# Patient Record
Sex: Male | Born: 1950 | Race: White | Hispanic: No | Marital: Single | Smoking: Never smoker
Health system: Southern US, Community
[De-identification: ages and names within clinical notes are randomized; demographics above are authoritative.]

## PROBLEM LIST (undated history)

## (undated) DIAGNOSIS — E039 Hypothyroidism, unspecified: Secondary | ICD-10-CM

## (undated) DIAGNOSIS — K219 Gastro-esophageal reflux disease without esophagitis: Secondary | ICD-10-CM

## (undated) DIAGNOSIS — IMO0001 Reserved for inherently not codable concepts without codable children: Secondary | ICD-10-CM

## (undated) DIAGNOSIS — Z87442 Personal history of urinary calculi: Secondary | ICD-10-CM

## (undated) DIAGNOSIS — I499 Cardiac arrhythmia, unspecified: Secondary | ICD-10-CM

## (undated) DIAGNOSIS — J449 Chronic obstructive pulmonary disease, unspecified: Secondary | ICD-10-CM

## (undated) DIAGNOSIS — I82409 Acute embolism and thrombosis of unspecified deep veins of unspecified lower extremity: Secondary | ICD-10-CM

## (undated) DIAGNOSIS — J189 Pneumonia, unspecified organism: Secondary | ICD-10-CM

## (undated) DIAGNOSIS — E0789 Other specified disorders of thyroid: Secondary | ICD-10-CM

## (undated) DIAGNOSIS — I456 Pre-excitation syndrome: Secondary | ICD-10-CM

## (undated) DIAGNOSIS — Z8719 Personal history of other diseases of the digestive system: Secondary | ICD-10-CM

## (undated) DIAGNOSIS — J45909 Unspecified asthma, uncomplicated: Secondary | ICD-10-CM

## (undated) DIAGNOSIS — C801 Malignant (primary) neoplasm, unspecified: Secondary | ICD-10-CM

## (undated) DIAGNOSIS — C73 Malignant neoplasm of thyroid gland: Secondary | ICD-10-CM

## (undated) DIAGNOSIS — I4891 Unspecified atrial fibrillation: Secondary | ICD-10-CM

## (undated) HISTORY — DX: Malignant neoplasm of thyroid gland: C73

## (undated) HISTORY — PX: ABLATION: SHX5711

## (undated) HISTORY — PX: CHOLECYSTECTOMY: SHX55

## (undated) HISTORY — PX: EXCISION OF TONGUE LESION: SHX6434

## (undated) HISTORY — DX: Acute embolism and thrombosis of unspecified deep veins of unspecified lower extremity: I82.409

## (undated) HISTORY — PX: KNEE ARTHROSCOPY W/ MENISCAL REPAIR: SHX1877

## (undated) HISTORY — PX: OTHER SURGICAL HISTORY: SHX169

## (undated) HISTORY — PX: EYE SURGERY: SHX253

## (undated) HISTORY — PX: HERNIA REPAIR: SHX51

## (undated) HISTORY — PX: TONSILLECTOMY: SUR1361

## (undated) HISTORY — DX: Unspecified atrial fibrillation: I48.91

---

## 2003-12-06 HISTORY — PX: COLONOSCOPY: SHX174

## 2003-12-11 ENCOUNTER — Inpatient Hospital Stay (HOSPITAL_COMMUNITY): Admission: AD | Admit: 2003-12-11 | Discharge: 2003-12-12 | Payer: Self-pay | Admitting: Cardiology

## 2003-12-15 ENCOUNTER — Ambulatory Visit (HOSPITAL_COMMUNITY): Admission: RE | Admit: 2003-12-15 | Discharge: 2003-12-16 | Payer: Self-pay | Admitting: Internal Medicine

## 2010-08-31 ENCOUNTER — Ambulatory Visit: Payer: Self-pay | Admitting: Cardiology

## 2011-09-23 DIAGNOSIS — R079 Chest pain, unspecified: Secondary | ICD-10-CM

## 2011-09-26 DIAGNOSIS — R072 Precordial pain: Secondary | ICD-10-CM

## 2013-05-20 DIAGNOSIS — R55 Syncope and collapse: Secondary | ICD-10-CM

## 2014-12-05 DIAGNOSIS — C73 Malignant neoplasm of thyroid gland: Secondary | ICD-10-CM

## 2014-12-05 HISTORY — DX: Malignant neoplasm of thyroid gland: C73

## 2015-04-16 ENCOUNTER — Ambulatory Visit (INDEPENDENT_AMBULATORY_CARE_PROVIDER_SITE_OTHER): Payer: Medicare Other | Admitting: Otolaryngology

## 2015-04-16 DIAGNOSIS — R1312 Dysphagia, oropharyngeal phase: Secondary | ICD-10-CM | POA: Diagnosis not present

## 2015-04-16 DIAGNOSIS — D440 Neoplasm of uncertain behavior of thyroid gland: Secondary | ICD-10-CM

## 2015-04-17 ENCOUNTER — Other Ambulatory Visit (INDEPENDENT_AMBULATORY_CARE_PROVIDER_SITE_OTHER): Payer: Self-pay | Admitting: Otolaryngology

## 2015-04-17 DIAGNOSIS — R221 Localized swelling, mass and lump, neck: Secondary | ICD-10-CM

## 2015-04-17 DIAGNOSIS — E041 Nontoxic single thyroid nodule: Secondary | ICD-10-CM

## 2015-04-20 ENCOUNTER — Other Ambulatory Visit: Payer: Self-pay | Admitting: Otolaryngology

## 2015-04-21 ENCOUNTER — Ambulatory Visit (HOSPITAL_COMMUNITY)
Admission: RE | Admit: 2015-04-21 | Discharge: 2015-04-21 | Disposition: A | Discharge status: Home / Self Care | Payer: Medicare Other | Source: Ambulatory Visit | Attending: Otolaryngology | Admitting: Otolaryngology

## 2015-04-21 DIAGNOSIS — R221 Localized swelling, mass and lump, neck: Secondary | ICD-10-CM | POA: Insufficient documentation

## 2015-04-21 DIAGNOSIS — E041 Nontoxic single thyroid nodule: Secondary | ICD-10-CM | POA: Diagnosis not present

## 2015-04-21 LAB — POCT I-STAT CREATININE: Creatinine, Ser: 0.9 mg/dL (ref 0.61–1.24)

## 2015-04-21 MED ORDER — IOHEXOL 300 MG/ML  SOLN
100.0000 mL | Freq: Once | INTRAMUSCULAR | Status: AC | PRN
  Administered 2015-04-21: 75 mL via INTRAVENOUS

## 2015-05-26 ENCOUNTER — Encounter (HOSPITAL_COMMUNITY): Payer: Self-pay

## 2015-05-26 ENCOUNTER — Encounter (HOSPITAL_COMMUNITY)
Admission: RE | Admit: 2015-05-26 | Discharge: 2015-05-26 | Disposition: A | Discharge status: Home / Self Care | Payer: Medicare Other | Source: Ambulatory Visit | Attending: Otolaryngology | Admitting: Otolaryngology

## 2015-05-26 ENCOUNTER — Encounter (HOSPITAL_COMMUNITY)
Admission: RE | Admit: 2015-05-26 | Discharge: 2015-05-26 | Disposition: A | Discharge status: Home / Self Care | Payer: Medicare Other | Source: Ambulatory Visit | Attending: Anesthesiology | Admitting: Anesthesiology

## 2015-05-26 DIAGNOSIS — Z79899 Other long term (current) drug therapy: Secondary | ICD-10-CM | POA: Diagnosis not present

## 2015-05-26 DIAGNOSIS — K219 Gastro-esophageal reflux disease without esophagitis: Secondary | ICD-10-CM | POA: Insufficient documentation

## 2015-05-26 DIAGNOSIS — Z86711 Personal history of pulmonary embolism: Secondary | ICD-10-CM | POA: Insufficient documentation

## 2015-05-26 DIAGNOSIS — Z01811 Encounter for preprocedural respiratory examination: Secondary | ICD-10-CM

## 2015-05-26 DIAGNOSIS — Z01818 Encounter for other preprocedural examination: Secondary | ICD-10-CM | POA: Insufficient documentation

## 2015-05-26 DIAGNOSIS — E079 Disorder of thyroid, unspecified: Secondary | ICD-10-CM | POA: Diagnosis not present

## 2015-05-26 DIAGNOSIS — I4891 Unspecified atrial fibrillation: Secondary | ICD-10-CM | POA: Insufficient documentation

## 2015-05-26 DIAGNOSIS — Z86718 Personal history of other venous thrombosis and embolism: Secondary | ICD-10-CM | POA: Diagnosis not present

## 2015-05-26 DIAGNOSIS — J9811 Atelectasis: Secondary | ICD-10-CM | POA: Diagnosis not present

## 2015-05-26 DIAGNOSIS — J449 Chronic obstructive pulmonary disease, unspecified: Secondary | ICD-10-CM | POA: Diagnosis not present

## 2015-05-26 DIAGNOSIS — Z01812 Encounter for preprocedural laboratory examination: Secondary | ICD-10-CM | POA: Insufficient documentation

## 2015-05-26 DIAGNOSIS — Z7901 Long term (current) use of anticoagulants: Secondary | ICD-10-CM | POA: Diagnosis not present

## 2015-05-26 DIAGNOSIS — I517 Cardiomegaly: Secondary | ICD-10-CM | POA: Diagnosis not present

## 2015-05-26 HISTORY — DX: Chronic obstructive pulmonary disease, unspecified: J44.9

## 2015-05-26 HISTORY — DX: Personal history of other diseases of the digestive system: Z87.19

## 2015-05-26 HISTORY — DX: Gastro-esophageal reflux disease without esophagitis: K21.9

## 2015-05-26 HISTORY — DX: Pre-excitation syndrome: I45.6

## 2015-05-26 HISTORY — DX: Other specified disorders of thyroid: E07.89

## 2015-05-26 HISTORY — DX: Reserved for inherently not codable concepts without codable children: IMO0001

## 2015-05-26 HISTORY — DX: Cardiac arrhythmia, unspecified: I49.9

## 2015-05-26 HISTORY — DX: Malignant (primary) neoplasm, unspecified: C80.1

## 2015-05-26 LAB — BASIC METABOLIC PANEL
Anion gap: 11 (ref 5–15)
BUN: 12 mg/dL (ref 6–20)
CO2: 30 mmol/L (ref 22–32)
CREATININE: 1.14 mg/dL (ref 0.61–1.24)
Calcium: 9.1 mg/dL (ref 8.9–10.3)
Chloride: 98 mmol/L — ABNORMAL LOW (ref 101–111)
GFR calc Af Amer: >60 mL/min (ref >60)
GFR calc non Af Amer: >60 mL/min (ref >60)
Glucose, Bld: 122 mg/dL — ABNORMAL HIGH (ref 65–99)
Potassium: 4 mmol/L (ref 3.5–5.1)
Sodium: 139 mmol/L (ref 135–145)

## 2015-05-26 LAB — CBC
HCT: 46.5 % (ref 39.0–52.0)
HEMOGLOBIN: 15.7 g/dL (ref 13.0–17.0)
MCH: 32.4 pg (ref 26.0–34.0)
MCHC: 33.8 g/dL (ref 30.0–36.0)
MCV: 95.9 fL (ref 78.0–100.0)
Platelets: 146 10*3/uL — ABNORMAL LOW (ref 150–400)
RBC: 4.85 MIL/uL (ref 4.22–5.81)
RDW: 14.3 % (ref 11.5–15.5)
WBC: 9.1 10*3/uL (ref 4.0–10.5)

## 2015-05-26 NOTE — Pre-Procedure Instructions (Signed)
Jim Wilson  05/26/2015     No Pharmacies Listed   Your procedure is scheduled on : June 29th, Wednesday   Report to [facility] Admitting at 6:30 AM   Call this number if you have problems the morning of surgery:  [phone]   Remember:  Do not eat food or drink liquids after midnight Tuesday.   Take these medicines the morning of surgery with A SIP OF WATER: Cardizem, Prilosec, Flomax   Do not wear jewelry - no rings or watches.  Do not wear lotions or colognes.   You may NOT  wear deodorant the day of surgery.             Men may shave face and neck.   Do not bring valuables to the hospital.  [facility] is not responsible for any belongings or valuables.  Contacts, dentures or bridgework may not be worn into surgery.  Leave your suitcase in the car.  After surgery it may be brought to your room. For patients admitted to the hospital, discharge time will be determined by your treatment team.  Name and phone number of your driver:     Special instructions:  "Preparing for Surgery" instruction sheet.  Please read over the following fact sheets that you were given. Pain Booklet and Surgical Site Infection Prevention

## 2015-05-26 NOTE — Progress Notes (Signed)
   05/26/15 1112  OBSTRUCTIVE SLEEP APNEA  Have you ever been diagnosed with sleep apnea through a sleep study? No  Do you snore loudly (loud enough to be heard through closed doors)?  0 (doesn't know, he lives alone)  Do you often feel tired, fatigued, or sleepy during the daytime? 0  Has anyone observed you stop breathing during your sleep? 0  Do you have, or are you being treated for high blood pressure? 0  BMI more than 35 kg/m2? 1  Age over 69 years old? 1  Neck circumference greater than 40 cm/16 inches? 1  Gender: 1

## 2015-05-27 NOTE — Progress Notes (Addendum)
Anesthesia Chart Review:  Pt is 64 year old male scheduled for R hemithyroidectomy on 06/03/2015 with Dr. Benjamine Mola.   PCP is Rory Percy at [facility].   PMH includes: atrial fibrillation, COPD, WPW syndrome (s/p ablation x2), PE, DVT, GERD. Never smoker. BMI 43.5  Pt does not see cardiology, and has not had trouble with WPW since last ablation which he thinks was 15-18 years ago at  [facility].   Medications include: diltiazem, lasix, potassium, zocor, coumadin. Dr. Nadara Mustard has instructed pt to stop coumadin 05/31/15.   Preoperative labs reviewed.   Chest x-ray 05/26/2015 reviewed. Cardiomegaly. Mild basilar atelectasis. No pulmonary edema. No segmental infiltrate.   EKG 05/26/2015: Atrial fibrillation. Possible Anterolateral infarct. Appears unchanged when compared to EKG dated 08/19/2010.   Nuclear stress test 08/30/2010: -probably normal LV perfusion. Stress testing induced no chest pain symptoms and no ECG changes consistent with ischemia. Mild inferobasal and apical thinning but no ischemia. Global LV systolic function was mildly reduced, with an EF of 47E%. In addition, mild global hypokinesis was present.   Reviewed EKG with Dr. Ermalene Postin.   If no changes, I anticipate pt can proceed with surgery as scheduled.   Willeen Cass, FNP-BC [facility]/Anesthesiology Phone: [phone] 05/27/2015 1:44 PM

## 2015-06-03 ENCOUNTER — Ambulatory Visit (HOSPITAL_COMMUNITY)
Admission: RE | Admit: 2015-06-03 | Discharge: 2015-06-04 | Disposition: A | Discharge status: Home / Self Care | Payer: Medicare Other | Source: Ambulatory Visit | Attending: Otolaryngology | Admitting: Otolaryngology

## 2015-06-03 ENCOUNTER — Ambulatory Visit (HOSPITAL_COMMUNITY): Payer: Medicare Other | Admitting: Certified Registered Nurse Anesthetist

## 2015-06-03 ENCOUNTER — Ambulatory Visit (HOSPITAL_COMMUNITY): Payer: Medicare Other | Admitting: Emergency Medicine

## 2015-06-03 ENCOUNTER — Encounter (HOSPITAL_COMMUNITY)
Admission: RE | Disposition: A | Discharge status: Home / Self Care | Payer: Self-pay | Source: Ambulatory Visit | Attending: Otolaryngology

## 2015-06-03 ENCOUNTER — Encounter (HOSPITAL_COMMUNITY): Payer: Self-pay | Admitting: Certified Registered Nurse Anesthetist

## 2015-06-03 DIAGNOSIS — Z9889 Other specified postprocedural states: Secondary | ICD-10-CM

## 2015-06-03 DIAGNOSIS — D34 Benign neoplasm of thyroid gland: Secondary | ICD-10-CM | POA: Insufficient documentation

## 2015-06-03 DIAGNOSIS — D440 Neoplasm of uncertain behavior of thyroid gland: Secondary | ICD-10-CM | POA: Diagnosis not present

## 2015-06-03 DIAGNOSIS — C73 Malignant neoplasm of thyroid gland: Secondary | ICD-10-CM | POA: Insufficient documentation

## 2015-06-03 DIAGNOSIS — J449 Chronic obstructive pulmonary disease, unspecified: Secondary | ICD-10-CM | POA: Insufficient documentation

## 2015-06-03 DIAGNOSIS — K219 Gastro-esophageal reflux disease without esophagitis: Secondary | ICD-10-CM | POA: Insufficient documentation

## 2015-06-03 DIAGNOSIS — Z6841 Body Mass Index (BMI) 40.0 and over, adult: Secondary | ICD-10-CM | POA: Diagnosis not present

## 2015-06-03 DIAGNOSIS — E890 Postprocedural hypothyroidism: Secondary | ICD-10-CM

## 2015-06-03 DIAGNOSIS — E042 Nontoxic multinodular goiter: Secondary | ICD-10-CM | POA: Diagnosis present

## 2015-06-03 HISTORY — PX: THYROIDECTOMY: SHX17

## 2015-06-03 LAB — PROTIME-INR
INR: 1.29 (ref 0.00–1.49)
PROTHROMBIN TIME: 16.3 s — AB (ref 11.6–15.2)

## 2015-06-03 LAB — APTT: APTT: 30 s (ref 24–37)

## 2015-06-03 SURGERY — THYROIDECTOMY
Anesthesia: General | Site: Neck | Laterality: Right

## 2015-06-03 MED ORDER — ONDANSETRON HCL 4 MG/2ML IJ SOLN: 4.0000 mg | INTRAMUSCULAR | Status: DC | PRN

## 2015-06-03 MED ORDER — 0.9 % SODIUM CHLORIDE (POUR BTL) OPTIME
TOPICAL | Status: DC | PRN
  Administered 2015-06-03: 1000 mL

## 2015-06-03 MED ORDER — SODIUM CHLORIDE 0.9 % IV SOLN
0.0125 ug/kg/min | INTRAVENOUS | Status: DC
  Administered 2015-06-03: .05 ug/kg/min via INTRAVENOUS
  Filled 2015-06-03: qty 1000

## 2015-06-03 MED ORDER — MIDAZOLAM HCL 2 MG/2ML IJ SOLN
INTRAMUSCULAR | Status: AC
  Filled 2015-06-03: qty 2

## 2015-06-03 MED ORDER — CEFAZOLIN SODIUM-DEXTROSE 2-3 GM-% IV SOLR
INTRAVENOUS | Status: DC | PRN
  Administered 2015-06-03: 2 g via INTRAVENOUS

## 2015-06-03 MED ORDER — LIDOCAINE HCL 4 % MT SOLN
OROMUCOSAL | Status: DC | PRN
  Administered 2015-06-03: 4 mL via TOPICAL

## 2015-06-03 MED ORDER — FENTANYL CITRATE (PF) 100 MCG/2ML IJ SOLN
INTRAMUSCULAR | Status: DC | PRN
  Administered 2015-06-03: 100 ug via INTRAVENOUS
  Administered 2015-06-03: 50 ug via INTRAVENOUS

## 2015-06-03 MED ORDER — ARTIFICIAL TEARS OP OINT
TOPICAL_OINTMENT | OPHTHALMIC | Status: AC
  Filled 2015-06-03: qty 3.5

## 2015-06-03 MED ORDER — ONDANSETRON HCL 4 MG/2ML IJ SOLN
INTRAMUSCULAR | Status: DC | PRN
  Administered 2015-06-03: 4 mg via INTRAVENOUS

## 2015-06-03 MED ORDER — MOMETASONE FURO-FORMOTEROL FUM 100-5 MCG/ACT IN AERO
2.0000 | INHALATION_SPRAY | Freq: Two times a day (BID) | RESPIRATORY_TRACT | Status: DC
  Administered 2015-06-03 – 2015-06-04 (×2): 2 via RESPIRATORY_TRACT
  Filled 2015-06-03: qty 8.8

## 2015-06-03 MED ORDER — KETOCONAZOLE 2 % EX CREA
1.0000 "application " | TOPICAL_CREAM | Freq: Three times a day (TID) | CUTANEOUS | Status: DC | PRN
  Filled 2015-06-03: qty 15

## 2015-06-03 MED ORDER — POTASSIUM CHLORIDE ER 10 MEQ PO TBCR
10.0000 meq | EXTENDED_RELEASE_TABLET | Freq: Every day | ORAL | Status: DC
Start: 2015-06-03 — End: 2015-06-04
  Administered 2015-06-03 – 2015-06-04 (×2): 10 meq via ORAL
  Filled 2015-06-03 (×4): qty 1

## 2015-06-03 MED ORDER — PHENYLEPHRINE 40 MCG/ML (10ML) SYRINGE FOR IV PUSH (FOR BLOOD PRESSURE SUPPORT)
PREFILLED_SYRINGE | INTRAVENOUS | Status: AC
  Filled 2015-06-03: qty 10

## 2015-06-03 MED ORDER — OXYCODONE-ACETAMINOPHEN 5-325 MG PO TABS: 1.0000 | ORAL_TABLET | ORAL | Status: DC | PRN

## 2015-06-03 MED ORDER — FUROSEMIDE 80 MG PO TABS
80.0000 mg | ORAL_TABLET | Freq: Every day | ORAL | Status: DC
  Administered 2015-06-03: 80 mg via ORAL
  Filled 2015-06-03 (×2): qty 1

## 2015-06-03 MED ORDER — PROPOFOL 10 MG/ML IV BOLUS
INTRAVENOUS | Status: DC | PRN
  Administered 2015-06-03: 150 mg via INTRAVENOUS

## 2015-06-03 MED ORDER — KCL IN DEXTROSE-NACL 20-5-0.45 MEQ/L-%-% IV SOLN
INTRAVENOUS | Status: DC
  Administered 2015-06-03: 22:00:00 via INTRAVENOUS
  Filled 2015-06-03: qty 1000

## 2015-06-03 MED ORDER — LIDOCAINE-EPINEPHRINE 1 %-1:100000 IJ SOLN
INTRAMUSCULAR | Status: AC
  Filled 2015-06-03: qty 1

## 2015-06-03 MED ORDER — DILTIAZEM HCL ER COATED BEADS 120 MG PO CP24
120.0000 mg | ORAL_CAPSULE | Freq: Every day | ORAL | Status: DC
  Administered 2015-06-03 – 2015-06-04 (×2): 120 mg via ORAL
  Filled 2015-06-03 (×2): qty 1

## 2015-06-03 MED ORDER — ALBUTEROL SULFATE (2.5 MG/3ML) 0.083% IN NEBU: 2.5000 mg | INHALATION_SOLUTION | Freq: Four times a day (QID) | RESPIRATORY_TRACT | Status: DC | PRN

## 2015-06-03 MED ORDER — PROMETHAZINE HCL 25 MG/ML IJ SOLN: 6.2500 mg | INTRAMUSCULAR | Status: DC | PRN

## 2015-06-03 MED ORDER — PHENYLEPHRINE HCL 10 MG/ML IJ SOLN
INTRAMUSCULAR | Status: DC | PRN
  Administered 2015-06-03: 80 ug via INTRAVENOUS
  Administered 2015-06-03: 40 ug via INTRAVENOUS
  Administered 2015-06-03: 120 ug via INTRAVENOUS
  Administered 2015-06-03 (×2): 80 ug via INTRAVENOUS

## 2015-06-03 MED ORDER — SUCCINYLCHOLINE CHLORIDE 20 MG/ML IJ SOLN
INTRAMUSCULAR | Status: DC | PRN
  Administered 2015-06-03: 140 mg via INTRAVENOUS

## 2015-06-03 MED ORDER — TAMSULOSIN HCL 0.4 MG PO CAPS
0.4000 mg | ORAL_CAPSULE | Freq: Two times a day (BID) | ORAL | Status: DC
Start: 2015-06-03 — End: 2015-06-04
  Administered 2015-06-03 – 2015-06-04 (×3): 0.4 mg via ORAL
  Filled 2015-06-03 (×3): qty 1

## 2015-06-03 MED ORDER — PANTOPRAZOLE SODIUM 40 MG PO TBEC
40.0000 mg | DELAYED_RELEASE_TABLET | Freq: Every day | ORAL | Status: DC
Start: 2015-06-03 — End: 2015-06-04
  Administered 2015-06-03 – 2015-06-04 (×2): 40 mg via ORAL
  Filled 2015-06-03 (×2): qty 1

## 2015-06-03 MED ORDER — LIDOCAINE-EPINEPHRINE 1 %-1:100000 IJ SOLN
INTRAMUSCULAR | Status: DC | PRN
  Administered 2015-06-03: 20 mL

## 2015-06-03 MED ORDER — LIDOCAINE HCL (CARDIAC) 20 MG/ML IV SOLN
INTRAVENOUS | Status: DC | PRN
  Administered 2015-06-03: 100 mg via INTRAVENOUS

## 2015-06-03 MED ORDER — MORPHINE SULFATE 2 MG/ML IJ SOLN
2.0000 mg | INTRAMUSCULAR | Status: DC | PRN
  Administered 2015-06-03: 2 mg via INTRAVENOUS
  Filled 2015-06-03: qty 1

## 2015-06-03 MED ORDER — LACTATED RINGERS IV SOLN
INTRAVENOUS | Status: DC | PRN
  Administered 2015-06-03 (×2): via INTRAVENOUS

## 2015-06-03 MED ORDER — SIMVASTATIN 20 MG PO TABS
20.0000 mg | ORAL_TABLET | Freq: Every day | ORAL | Status: DC
  Administered 2015-06-03: 20 mg via ORAL
  Filled 2015-06-03: qty 1

## 2015-06-03 MED ORDER — MIDAZOLAM HCL 5 MG/5ML IJ SOLN
INTRAMUSCULAR | Status: DC | PRN
  Administered 2015-06-03: 2 mg via INTRAVENOUS

## 2015-06-03 MED ORDER — ZOLPIDEM TARTRATE 5 MG PO TABS: 5.0000 mg | ORAL_TABLET | Freq: Every evening | ORAL | Status: DC | PRN

## 2015-06-03 MED ORDER — AMOXICILLIN 875 MG PO TABS: 875.0000 mg | ORAL_TABLET | Freq: Two times a day (BID) | ORAL | Status: DC

## 2015-06-03 MED ORDER — FENTANYL CITRATE (PF) 250 MCG/5ML IJ SOLN
INTRAMUSCULAR | Status: AC
  Filled 2015-06-03: qty 5

## 2015-06-03 MED ORDER — MEPERIDINE HCL 25 MG/ML IJ SOLN: 6.2500 mg | INTRAMUSCULAR | Status: DC | PRN

## 2015-06-03 MED ORDER — HYDROMORPHONE HCL 1 MG/ML IJ SOLN: 0.2500 mg | INTRAMUSCULAR | Status: DC | PRN

## 2015-06-03 MED ORDER — LIDOCAINE HCL (CARDIAC) 20 MG/ML IV SOLN
INTRAVENOUS | Status: AC
  Filled 2015-06-03: qty 10

## 2015-06-03 MED ORDER — ATORVASTATIN CALCIUM 10 MG PO TABS: 10.0000 mg | ORAL_TABLET | Freq: Every day | ORAL | Status: DC

## 2015-06-03 MED ORDER — DEXAMETHASONE SODIUM PHOSPHATE 10 MG/ML IJ SOLN
INTRAMUSCULAR | Status: DC | PRN
  Administered 2015-06-03: 10 mg via INTRAVENOUS

## 2015-06-03 MED ORDER — ONDANSETRON HCL 4 MG/2ML IJ SOLN
INTRAMUSCULAR | Status: AC
  Filled 2015-06-03: qty 2

## 2015-06-03 MED ORDER — PROPOFOL 10 MG/ML IV BOLUS
INTRAVENOUS | Status: AC
  Filled 2015-06-03: qty 20

## 2015-06-03 MED ORDER — ONDANSETRON HCL 4 MG PO TABS: 4.0000 mg | ORAL_TABLET | ORAL | Status: DC | PRN

## 2015-06-03 MED ORDER — GLYCOPYRROLATE 0.2 MG/ML IJ SOLN
INTRAMUSCULAR | Status: DC | PRN
  Administered 2015-06-03 (×2): 0.1 mg via INTRAVENOUS

## 2015-06-03 MED ORDER — SUCCINYLCHOLINE CHLORIDE 20 MG/ML IJ SOLN
INTRAMUSCULAR | Status: AC
  Filled 2015-06-03: qty 1

## 2015-06-03 SURGICAL SUPPLY — 54 items
ATTRACTOMAT 16X20 MAGNETIC DRP (DRAPES) ×3 IMPLANT
BENZOIN TINCTURE PRP APPL 2/3 (GAUZE/BANDAGES/DRESSINGS) ×3 IMPLANT
BLADE 10 SAFETY STRL DISP (BLADE) ×3 IMPLANT
BLADE SURG 15 STRL LF DISP TIS (BLADE) ×1 IMPLANT
BLADE SURG 15 STRL SS (BLADE) ×2
BLADE SURG CLIPPER 3M 9600 (MISCELLANEOUS) ×3 IMPLANT
CANISTER SUCTION 2500CC (MISCELLANEOUS) ×3 IMPLANT
CLEANER TIP ELECTROSURG 2X2 (MISCELLANEOUS) ×3 IMPLANT
CLIP TI WIDE RED SMALL 24 (CLIP) IMPLANT
CONT SPEC 4OZ CLIKSEAL STRL BL (MISCELLANEOUS) ×3 IMPLANT
CORDS BIPOLAR (ELECTRODE) ×6 IMPLANT
COVER SURGICAL LIGHT HANDLE (MISCELLANEOUS) ×3 IMPLANT
CRADLE DONUT ADULT HEAD (MISCELLANEOUS) ×3 IMPLANT
DERMABOND ADVANCED (GAUZE/BANDAGES/DRESSINGS) ×2
DERMABOND ADVANCED .7 DNX12 (GAUZE/BANDAGES/DRESSINGS) ×1 IMPLANT
DRAIN CHANNEL 10F 3/8 F FF (DRAIN) ×3 IMPLANT
DRAPE PROXIMA HALF (DRAPES) ×3 IMPLANT
DRAPE U-SHAPE 76X120 STRL (DRAPES) ×3 IMPLANT
ELECT COATED BLADE 2.86 ST (ELECTRODE) ×3 IMPLANT
ELECT REM PT RETURN 9FT ADLT (ELECTROSURGICAL) ×3
ELECTRODE REM PT RTRN 9FT ADLT (ELECTROSURGICAL) ×1 IMPLANT
EVACUATOR SILICONE 100CC (DRAIN) ×3 IMPLANT
GAUZE SPONGE 4X4 16PLY XRAY LF (GAUZE/BANDAGES/DRESSINGS) ×9 IMPLANT
GLOVE BIO SURGEON STRL SZ 6.5 (GLOVE) ×2 IMPLANT
GLOVE BIO SURGEON STRL SZ7 (GLOVE) ×3 IMPLANT
GLOVE BIO SURGEON STRL SZ7.5 (GLOVE) ×3 IMPLANT
GLOVE BIO SURGEONS STRL SZ 6.5 (GLOVE) ×1
GLOVE BIOGEL PI IND STRL 6.5 (GLOVE) ×1 IMPLANT
GLOVE BIOGEL PI IND STRL 7.0 (GLOVE) ×2 IMPLANT
GLOVE BIOGEL PI INDICATOR 6.5 (GLOVE) ×2
GLOVE BIOGEL PI INDICATOR 7.0 (GLOVE) ×4
GLOVE ECLIPSE 7.5 STRL STRAW (GLOVE) ×3 IMPLANT
GLOVE SURG SS PI 6.5 STRL IVOR (GLOVE) ×3 IMPLANT
GOWN STRL REUS W/ TWL LRG LVL3 (GOWN DISPOSABLE) ×4 IMPLANT
GOWN STRL REUS W/TWL LRG LVL3 (GOWN DISPOSABLE) ×8
HEMOSTAT SURGICEL 2X14 (HEMOSTASIS) IMPLANT
KIT BASIN OR (CUSTOM PROCEDURE TRAY) ×3 IMPLANT
KIT ROOM TURNOVER OR (KITS) ×3 IMPLANT
LIQUID BAND (GAUZE/BANDAGES/DRESSINGS) IMPLANT
NEEDLE HYPO 25GX1X1/2 BEV (NEEDLE) ×3 IMPLANT
NS IRRIG 1000ML POUR BTL (IV SOLUTION) ×3 IMPLANT
PAD ARMBOARD 7.5X6 YLW CONV (MISCELLANEOUS) ×3 IMPLANT
PENCIL BUTTON HOLSTER BLD 10FT (ELECTRODE) ×3 IMPLANT
PROBE NERVBE PRASS .33 (MISCELLANEOUS) ×3 IMPLANT
SHEARS HARMONIC 9CM CVD (BLADE) ×3 IMPLANT
SPONGE INTESTINAL PEANUT (DISPOSABLE) ×3 IMPLANT
SUT ETHILON 2 0 FS 18 (SUTURE) ×3 IMPLANT
SUT PROLENE 6 0 CC 1 (SUTURE) ×3 IMPLANT
SUT SILK 2 0 FS (SUTURE) ×3 IMPLANT
SUT SILK 3 0 REEL (SUTURE) ×3 IMPLANT
SUT VICRYL 4-0 PS2 18IN ABS (SUTURE) ×6 IMPLANT
TAPE CLOTH 4X10 WHT NS (GAUZE/BANDAGES/DRESSINGS) ×3 IMPLANT
TRAY ENT MC OR (CUSTOM PROCEDURE TRAY) ×3 IMPLANT
TUBE ENDOTRAC EMG 8X11.3 (MISCELLANEOUS) ×3 IMPLANT

## 2015-06-03 NOTE — H&P (Signed)
Cc: Large right thyroid mass  HPI: The patient is a 64 y/o male who presents today for evaluation of bilateral thyroid nodules. The patient is seen in consultation requested by [place]. According to the patient, he was noted by the [facility] nurse to have an enlarged thyroid. He subsequently underwent a neck US which showed that the entire right lobe was occupied by a nodule measuring 4.9 cm. A 2.8 cm nodule was also noted along the isthmus.  The patient currently denies any significant dysphagia or difficulty breathing. He had some benign tongue lesions removed in the past. The patient states his mother had a thyroid goiter but never had it removed.  The patient's review of systems (constitutional, eyes, ENT, cardiovascular, respiratory, GI, musculoskeletal, skin, neurologic, psychiatric, endocrine, hematologic, allergic) is noted in the ROS questionnaire.  It is reviewed with the patient.   Allergies: None.  Family health history: None.   Major events: Hernia,history of pulmonary emboli, Yves Dill Parkinson White syndrome treated with ablation.   Ongoing medical problems: Cataracts, hearing loss, irregular pulse, reflux, ulcers, arthritis, skin cancer, hay fever.   Social history: The patient is single. He denies the use of alcohol, tobacco or illegal drugs.  Exam General: Communicates without difficulty, obese, no acute distress. Head: Normocephalic, no evidence injury, no tenderness, facial buttresses intact without stepoff. Eyes: PERRL, EOMI. No scleral icterus, conjunctivae clear. Neuro: CN II exam reveals vision grossly intact.  No nystagmus at any point of gaze. Ears: Auricles well formed without lesions.  Ear canals are intact without mass or lesion.  No erythema or edema is appreciated.  The TMs are intact without fluid. Nose: External evaluation reveals normal support and skin without lesions.  Dorsum is intact.  Anterior rhinoscopy reveals healthy pink mucosa over anterior  aspect of inferior turbinates and intact septum.  No purulence noted. Oral:  Oral cavity and oropharynx are intact, symmetric, without erythema or edema.  Mucosa is moist without lesions. Neck: Full range of motion without pain.  There is no significant lymphadenopathy.  No masses palpable.  Thyroid bed is enlarged with palpable right thyroid nodule. Isthmus nodule is also palpable. Parotid glands and submandibular glands equal bilaterally without mass.  Trachea is shifted to the left. Neuro:  CN 2-12 grossly intact. Gait normal. Vestibular: No nystagmus at any point of gaze. The cerebellar examination is unremarkable.   Procedure:  Flexible Fiberoptic Laryngoscopy Risks, benefits, and alternatives of flexible endoscopy were explained to the patient.  Specific mention was made of the risk of throat numbness with difficulty swallowing, possible bleeding from the nose and mouth, and pain from the procedure.  The patient gave oral consent to proceed.  The nasal cavities were decongested and anesthetised with a combination of oxymetazoline and 4% lidocaine solution.  The flexible scope was inserted into the right nasal cavity and advanced towards the nasopharynx.  Visualized mucosa over the turbinates and septum were as described above.  The nasopharynx was clear.  Oropharyngeal walls were symmetric and mobile without lesion, mass, or edema.  Hypopharynx was also without  lesion or edema.  Larynx was mobile without lesions. Supraglottic structures were free of edema, mass, and asymmetry.  True vocal folds were white without mass or lesion.  Base of tongue was within normal limits.  Assessment 1.  The patient is noted to have a multinodular thyroid goiter. The right nodule measures 4.9 cm, isthmus 2.8, and a  1.2 cm nodule is noted on the left. 2.  The trachea is  shifted to the left. The patient's vocal cords are noted to be mobile. No other suspicious mass or lesion is noted on today's fiberoptic laryngoscopy  exam.  Plan 1.  The ultrasound and laryngoscopy findings are discussed with the patient.  2.  Treatment options are discussed, which includes observation, fine needle biopsy, versus right hemithyroidectomy. The risks, benefits, alternatives, and details of the procedure are reviewed with the patient. Questions are invited and answered. In light of the size of the nodules and tracheal deviation, the patient will likely benefit from undergoing a hemithyroidectomy procedure. 3.  The patient would like to proceed with surgical intervention. We will schedule the procedure in accordance with the patient's schedule.

## 2015-06-03 NOTE — Anesthesia Procedure Notes (Signed)
Procedure Name: Intubation Performed by: Judeth Cornfield T Pre-anesthesia Checklist: Patient identified, Emergency Drugs available, Suction available, Patient being monitored and Timeout performed Patient Re-evaluated:Patient Re-evaluated prior to inductionOxygen Delivery Method: Circle system utilized Preoxygenation: Pre-oxygenation with 100% oxygen Intubation Type: IV induction Ventilation: Mask ventilation without difficulty Laryngoscope Size: Mac, 4 and Glidescope Grade View: Grade I Tube type: Oral (Nimms ETT) Tube size: 8.0 mm Number of attempts: 1 Airway Equipment and Method: Patient positioned with wedge pillow and Video-laryngoscopy Placement Confirmation: ETT inserted through vocal cords under direct vision,  positive ETCO2 and breath sounds checked- equal and bilateral Secured at: 21 cm Tube secured with: Tape Dental Injury: Teeth and Oropharynx as per pre-operative assessment  Difficulty Due To: Difficulty was anticipated and Difficult Airway- due to large tongue Future Recommendations: Recommend- induction with short-acting agent, and alternative techniques readily available

## 2015-06-03 NOTE — Transfer of Care (Signed)
Immediate Anesthesia Transfer of Care Note  Patient: Jim Wilson  Procedure(s) Performed: Procedure(s): RIGHT HEMI THYROIDECTOMY (Right)  Patient Location: PACU  Anesthesia Type:General  Level of Consciousness: awake and patient cooperative  Airway & Oxygen Therapy: Patient Spontanous Breathing and Patient connected to face mask oxygen  Post-op Assessment: Report given to RN and Post -op Vital signs reviewed and stable  Post vital signs: Reviewed and stable  Last Vitals:  Filed Vitals:   06/03/15 0637  BP: 113/49  Pulse: 88  Temp: 36.6 C  Resp: 20    Complications: No apparent anesthesia complications

## 2015-06-03 NOTE — Discharge Instructions (Signed)
Thyroidectomy Care After Refer to this sheet in the next few weeks. These instructions provide you with general information on caring for yourself after you leave the hospital. Your caregiver also may give you specific instructions. Your treatment has been planned according to the most current medical practices available, but problems sometimes occur. Call your caregiver if you have any problems or questions after your procedure. HOME CARE INSTRUCTIONS   It is normal to be sore for a few weeks following surgery. See your caregiver if your pain seems to be getting worse rather than better.  You may resume a normal diet and activities as directed by your caregiver.  Avoid lifting weight greater than 20 lb (9 kg) or participating in heavy exercise or contact sports for 10 days or as instructed by your caregiver. SEEK MEDICAL CARE IF:   You have increased bleeding from your wound.  You have redness, swelling, or increasing pain from your wound or in your neck.  There is pus coming from your wound.  You have an oral temperature above 102 F (38.9 C).  There is a bad smell coming from the wound or dressing.  You develop lightheadedness or feel faint.  You develop numbness, tingling, or muscle spasms in your arms, hands, feet, or face.  You have difficulty swallowing. SEEK IMMEDIATE MEDICAL CARE IF:   You develop a rash.  You have difficulty breathing.  You hear whistling noises that come from your chest.  You develop a cough that becomes increasingly worse.  You develop any reaction or side effects to medicines given.  There is swelling in your neck.  You develop changes in speech or hoarseness, which is getting worse. MAKE SURE YOU:   Understand these instructions.  Will watch your condition.  Will get help right away if you are not doing well or get worse. Document Released: 06/10/2005 Document Revised: 02/13/2012 Document Reviewed: 01/28/2011 [facility] Patient  Information 2015 [place], [place]. This information is not intended to replace advice given to you by your health care provider. Make sure you discuss any questions you have with your health care provider.

## 2015-06-03 NOTE — Op Note (Signed)
DATE OF PROCEDURE:  06/03/2015                              OPERATIVE REPORT  SURGEON:  Leta Baptist, MD  ASSISTANT: Rometta Emery, PA-C  PREOPERATIVE DIAGNOSES: 1. Large right thyroid mass  POSTOPERATIVE DIAGNOSES: 1. Large right thyroid mass  PROCEDURE PERFORMED:  Right hemithyroidectomy  ANESTHESIA:  General endotracheal tube anesthesia.  COMPLICATIONS:  None.  ESTIMATED BLOOD LOSS:  Less than 26m  INDICATION FOR PROCEDURE:  BNASHTON BELSONis a 64y.o. male with a history of bilateral thyroid nodules. According to the patient, [facility]. He subsequently underwent a neck [place] which showed that the entire right lobe was occupied by a nodule measuring 4.9 cm. A 2.8 cm nodule was also noted along the isthmus. The large thyroid mass caused significant compression and deviation of the trachea to the left. Based on the above findings, the decision was made for patient to undergo the right hemithyroidectomy surgery. Likelihood of success in reducing symptoms was also discussed.  The risks, benefits, alternatives, and details of the procedure were discussed with the mother.  Questions were invited and answered.  Informed consent was obtained.  DESCRIPTION:  The patient was taken to the operating room and placed supine on the operating table.  General endotracheal tube anesthesia was administered by the anesthesiologist.  The patient was positioned and prepped and draped in a standard fashion for thyroidectomy surgery. NIMS recurrent laryngeal nerve monitoring endotracheal tube was placed. The monitoring system was functional throughout the case. Preop IV antibiotics was given.    1% lidocaine with 1-100,000 epinephrine was infiltrated into the planned site of incision. A transverse lower neck incision was made. The incision was carried down past the level of the platysma muscles. Superior and inferiorly based subplatysmal flaps were elevated  in a standard fashion. The strap muscles were divided at midline and retracted laterally, exposing a large right thyroid mass and a smaller isthmus nodule. Careful dissection was carried out to free the right thyroid lobe and isthmus from the surrounding soft tissue. The right recurrent laryngeal nerve was identified and preserved. The nerve was noted to be functional throughout the case. 2 possible parathyroid candidates were also identified and preserved. The entire right thyroid lobe and isthmus were removed. They were sent to the pathology department for permanent histologic identification. The surgical site was copiously irrigated. A #10 JP drain was placed. The strap muscles were closed with 4-0 Vicryls sutures. The incision was subsequently closed in layers with 4-0 Vicryls and Dermabond.   The care of the patient was turned over to the anesthesiologist.  The patient was awakened from anesthesia without difficulty.  He was extubated and transferred to the recovery room in good condition.  OPERATIVE FINDING: A large right thyroid mass and a smaller isthmus nodule.  SPECIMEN:  Right thyroid lobe and isthmus.  FOLLOWUP CARE:  The patient will be observed overnight in the hospital. He will most likely be discharged home on postop day #1.   TAscencion Dike6/29/2016 10:23 AM

## 2015-06-03 NOTE — Anesthesia Preprocedure Evaluation (Addendum)
Anesthesia Evaluation  Patient identified by MRN, date of birth, ID band Patient awake    Reviewed: Allergy & Precautions, NPO status , Patient's Chart, lab work & pertinent test results  Airway Mallampati: II  TM Distance: >3 FB Neck ROM: Full    Dental no notable dental hx.    Pulmonary shortness of breath, COPD COPD inhaler,  breath sounds clear to auscultation  Pulmonary exam normal       Cardiovascular negative cardio ROS Normal cardiovascular exam+ dysrhythmias Rhythm:Regular Rate:Normal     Neuro/Psych negative neurological ROS  negative psych ROS   GI/Hepatic Neg liver ROS, hiatal hernia, GERD-  ,  Endo/Other  Morbid obesity  Renal/GU Renal disease     Musculoskeletal negative musculoskeletal ROS (+)   Abdominal (+) + obese,   Peds  Hematology negative hematology ROS (+)   Anesthesia Other Findings   Reproductive/Obstetrics                             Anesthesia Physical Anesthesia Plan  ASA: III  Anesthesia Plan: General   Post-op Pain Management:    Induction: Intravenous  Airway Management Planned: Oral ETT  Additional Equipment:   Intra-op Plan:   Post-operative Plan: Extubation in OR  Informed Consent: I have reviewed the patients History and Physical, chart, labs and discussed the procedure including the risks, benefits and alternatives for the proposed anesthesia with the patient or authorized representative who has indicated his/her understanding and acceptance.   Dental advisory given  Plan Discussed with: CRNA  Anesthesia Plan Comments: (+/- VL)        Anesthesia Quick Evaluation

## 2015-06-04 ENCOUNTER — Encounter (HOSPITAL_COMMUNITY): Payer: Self-pay | Admitting: Otolaryngology

## 2015-06-04 DIAGNOSIS — C73 Malignant neoplasm of thyroid gland: Secondary | ICD-10-CM | POA: Diagnosis not present

## 2015-06-04 NOTE — Progress Notes (Signed)
Discharge instructions explained and pt denies any questions at this time.  Prescriptions given by MD to friend yesterday 6/29 after surgery.  Confirmed this with MD.  Iv removed and site CDI.  Pt discharged to home in no s/s of distress, Graceann Congress

## 2015-06-04 NOTE — Anesthesia Postprocedure Evaluation (Signed)
Anesthesia Post Note  Patient: Jim Wilson  Procedure(s) Performed: Procedure(s) (LRB): RIGHT HEMI THYROIDECTOMY (Right)  Anesthesia type: General  Patient location: PACU  Post pain: Pain level controlled  Post assessment: Post-op Vital signs reviewed  Last Vitals: BP 123/75 mmHg  Pulse 56  Temp(Src) 36.6 C (Oral)  Resp 18  Ht '6\' 1"'$  (5.872 m)  Wt 330 lb (149.687 kg)  BMI 43.55 kg/m2  SpO2 97%  Post vital signs: Reviewed  Level of consciousness: sedated  Complications: No apparent anesthesia complications

## 2015-06-04 NOTE — Discharge Summary (Signed)
Physician Discharge Summary  Patient ID: Jim Wilson MRN: [id number] DOB/AGE: 01/23/1951 64 y.o.  Admit date: 06/03/2015 Discharge date: 06/04/2015  Admission Diagnoses: Right thyroid mass  Discharge Diagnoses: Right thyroid mass Active Problems:   S/P partial thyroidectomy   Discharged Condition: good  Hospital Course: Pt had an uneventful overnight stay. Pt tolerated po well. No bleeding. No stridor. Voice is strong  Consults: None  Significant Diagnostic Studies: none  Treatments: surgery: right hemithyroidectomy  Discharge Exam: Blood pressure 118/67, pulse 56, temperature 97.8 F (36.6 C), temperature source Oral, resp. rate 18, height '6\' 1"'$  (1.854 m), weight 149.687 kg (330 lb), SpO2 97 %. Incision/Wound:c/d/i Voice is strong  Disposition:   Discharge Instructions    Activity as tolerated - No restrictions    Complete by:  As directed      Diet general    Complete by:  As directed             Medication List    STOP taking these medications        warfarin 3 MG tablet  Commonly known as:  COUMADIN      TAKE these medications        albuterol (2.5 MG/3ML) 0.083% nebulizer solution  Commonly known as:  PROVENTIL  Take 2.5 mg by nebulization every 6 (six) hours as needed for wheezing or shortness of breath.     amoxicillin 875 MG tablet  Commonly known as:  AMOXIL  Take 1 tablet (875 mg total) by mouth 2 (two) times daily.     cetirizine 10 MG tablet  Commonly known as:  ZYRTEC  Take 10 mg by mouth daily.     diltiazem 120 MG 24 hr capsule  Commonly known as:  CARDIZEM CD  Take 120 mg by mouth daily.     Fluticasone-Salmeterol 250-50 MCG/DOSE Aepb  Commonly known as:  ADVAIR  Inhale 2 puffs into the lungs daily.     furosemide 40 MG tablet  Commonly known as:  LASIX  Take 80 mg by mouth daily.     ketoconazole 2 % cream  Commonly known as:  NIZORAL  Apply 1 application topically 3 (three) times daily as needed for irritation.     omeprazole 20 MG capsule  Commonly known as:  PRILOSEC  Take 20 mg by mouth daily.     oxyCODONE-acetaminophen 5-325 MG per tablet  Commonly known as:  ROXICET  Take 1-2 tablets by mouth every 4 (four) hours as needed for severe pain.     potassium chloride 10 MEQ tablet  Commonly known as:  K-DUR  Take 10 mEq by mouth daily.     simvastatin 20 MG tablet  Commonly known as:  ZOCOR  Take 20 mg by mouth daily.     tamsulosin 0.4 MG Caps capsule  Commonly known as:  FLOMAX  Take 0.4 mg by mouth 2 (two) times daily.           Follow-up Information    Follow up with Ascencion Dike, MD On 06/11/2015.   Specialty:  Otolaryngology   Why:  As scheduled   Contact information:   [address] [place] [address] [phone]       Signed: Ascencion Dike 06/04/2015, 10:05 AM

## 2015-06-11 ENCOUNTER — Ambulatory Visit (INDEPENDENT_AMBULATORY_CARE_PROVIDER_SITE_OTHER): Payer: Medicare Other | Admitting: Otolaryngology

## 2015-06-11 DIAGNOSIS — C73 Malignant neoplasm of thyroid gland: Secondary | ICD-10-CM

## 2015-12-08 DIAGNOSIS — R05 Cough: Secondary | ICD-10-CM | POA: Diagnosis not present

## 2015-12-08 DIAGNOSIS — R609 Edema, unspecified: Secondary | ICD-10-CM | POA: Diagnosis not present

## 2015-12-08 DIAGNOSIS — J441 Chronic obstructive pulmonary disease with (acute) exacerbation: Secondary | ICD-10-CM | POA: Diagnosis not present

## 2015-12-08 DIAGNOSIS — J019 Acute sinusitis, unspecified: Secondary | ICD-10-CM | POA: Diagnosis not present

## 2015-12-08 DIAGNOSIS — I482 Chronic atrial fibrillation: Secondary | ICD-10-CM | POA: Diagnosis not present

## 2015-12-10 DIAGNOSIS — Z9889 Other specified postprocedural states: Secondary | ICD-10-CM | POA: Diagnosis not present

## 2015-12-10 DIAGNOSIS — N200 Calculus of kidney: Secondary | ICD-10-CM | POA: Diagnosis not present

## 2015-12-11 DIAGNOSIS — J019 Acute sinusitis, unspecified: Secondary | ICD-10-CM | POA: Diagnosis not present

## 2015-12-11 DIAGNOSIS — I482 Chronic atrial fibrillation: Secondary | ICD-10-CM | POA: Diagnosis not present

## 2015-12-11 DIAGNOSIS — R609 Edema, unspecified: Secondary | ICD-10-CM | POA: Diagnosis not present

## 2015-12-11 DIAGNOSIS — J441 Chronic obstructive pulmonary disease with (acute) exacerbation: Secondary | ICD-10-CM | POA: Diagnosis not present

## 2015-12-11 DIAGNOSIS — R05 Cough: Secondary | ICD-10-CM | POA: Diagnosis not present

## 2015-12-22 DIAGNOSIS — Z6841 Body Mass Index (BMI) 40.0 and over, adult: Secondary | ICD-10-CM | POA: Diagnosis not present

## 2015-12-22 DIAGNOSIS — N200 Calculus of kidney: Secondary | ICD-10-CM | POA: Diagnosis not present

## 2015-12-22 DIAGNOSIS — E7800 Pure hypercholesterolemia, unspecified: Secondary | ICD-10-CM | POA: Diagnosis not present

## 2015-12-22 DIAGNOSIS — Z85850 Personal history of malignant neoplasm of thyroid: Secondary | ICD-10-CM | POA: Diagnosis not present

## 2015-12-22 DIAGNOSIS — Z7952 Long term (current) use of systemic steroids: Secondary | ICD-10-CM | POA: Diagnosis not present

## 2015-12-22 DIAGNOSIS — Z87442 Personal history of urinary calculi: Secondary | ICD-10-CM | POA: Diagnosis not present

## 2015-12-22 DIAGNOSIS — Z79899 Other long term (current) drug therapy: Secondary | ICD-10-CM | POA: Diagnosis not present

## 2015-12-22 DIAGNOSIS — I4891 Unspecified atrial fibrillation: Secondary | ICD-10-CM | POA: Diagnosis not present

## 2015-12-22 DIAGNOSIS — Z9049 Acquired absence of other specified parts of digestive tract: Secondary | ICD-10-CM | POA: Diagnosis not present

## 2015-12-22 DIAGNOSIS — J439 Emphysema, unspecified: Secondary | ICD-10-CM | POA: Diagnosis not present

## 2015-12-22 DIAGNOSIS — R319 Hematuria, unspecified: Secondary | ICD-10-CM | POA: Diagnosis not present

## 2015-12-22 DIAGNOSIS — N201 Calculus of ureter: Secondary | ICD-10-CM | POA: Diagnosis not present

## 2015-12-22 DIAGNOSIS — Z7901 Long term (current) use of anticoagulants: Secondary | ICD-10-CM | POA: Diagnosis not present

## 2015-12-22 DIAGNOSIS — E669 Obesity, unspecified: Secondary | ICD-10-CM | POA: Diagnosis not present

## 2015-12-22 DIAGNOSIS — Z86718 Personal history of other venous thrombosis and embolism: Secondary | ICD-10-CM | POA: Diagnosis not present

## 2015-12-22 DIAGNOSIS — J45909 Unspecified asthma, uncomplicated: Secondary | ICD-10-CM | POA: Diagnosis not present

## 2015-12-30 DIAGNOSIS — N200 Calculus of kidney: Secondary | ICD-10-CM | POA: Diagnosis not present

## 2016-01-12 DIAGNOSIS — I482 Chronic atrial fibrillation: Secondary | ICD-10-CM | POA: Diagnosis not present

## 2016-01-28 DIAGNOSIS — I809 Phlebitis and thrombophlebitis of unspecified site: Secondary | ICD-10-CM | POA: Diagnosis not present

## 2016-01-28 DIAGNOSIS — I482 Chronic atrial fibrillation: Secondary | ICD-10-CM | POA: Diagnosis not present

## 2016-02-01 DIAGNOSIS — N200 Calculus of kidney: Secondary | ICD-10-CM | POA: Diagnosis not present

## 2016-02-02 DIAGNOSIS — Z86718 Personal history of other venous thrombosis and embolism: Secondary | ICD-10-CM | POA: Diagnosis not present

## 2016-02-02 DIAGNOSIS — M25562 Pain in left knee: Secondary | ICD-10-CM | POA: Diagnosis not present

## 2016-02-02 DIAGNOSIS — Z7901 Long term (current) use of anticoagulants: Secondary | ICD-10-CM | POA: Diagnosis not present

## 2016-02-02 DIAGNOSIS — I4891 Unspecified atrial fibrillation: Secondary | ICD-10-CM | POA: Diagnosis not present

## 2016-02-02 DIAGNOSIS — R404 Transient alteration of awareness: Secondary | ICD-10-CM | POA: Diagnosis not present

## 2016-02-02 DIAGNOSIS — Z79899 Other long term (current) drug therapy: Secondary | ICD-10-CM | POA: Diagnosis not present

## 2016-02-02 DIAGNOSIS — S8012XA Contusion of left lower leg, initial encounter: Secondary | ICD-10-CM | POA: Diagnosis not present

## 2016-02-02 DIAGNOSIS — J449 Chronic obstructive pulmonary disease, unspecified: Secondary | ICD-10-CM | POA: Diagnosis not present

## 2016-02-02 DIAGNOSIS — W010XXA Fall on same level from slipping, tripping and stumbling without subsequent striking against object, initial encounter: Secondary | ICD-10-CM | POA: Diagnosis not present

## 2016-02-02 DIAGNOSIS — R61 Generalized hyperhidrosis: Secondary | ICD-10-CM | POA: Diagnosis not present

## 2016-02-02 DIAGNOSIS — M179 Osteoarthritis of knee, unspecified: Secondary | ICD-10-CM | POA: Diagnosis not present

## 2016-02-02 DIAGNOSIS — J45909 Unspecified asthma, uncomplicated: Secondary | ICD-10-CM | POA: Diagnosis not present

## 2016-02-02 DIAGNOSIS — Z85828 Personal history of other malignant neoplasm of skin: Secondary | ICD-10-CM | POA: Diagnosis not present

## 2016-02-02 DIAGNOSIS — E7800 Pure hypercholesterolemia, unspecified: Secondary | ICD-10-CM | POA: Diagnosis not present

## 2016-02-02 DIAGNOSIS — M1712 Unilateral primary osteoarthritis, left knee: Secondary | ICD-10-CM | POA: Diagnosis not present

## 2016-02-02 DIAGNOSIS — Z86711 Personal history of pulmonary embolism: Secondary | ICD-10-CM | POA: Diagnosis not present

## 2016-02-02 DIAGNOSIS — R531 Weakness: Secondary | ICD-10-CM | POA: Diagnosis not present

## 2016-02-02 DIAGNOSIS — S8002XA Contusion of left knee, initial encounter: Secondary | ICD-10-CM | POA: Diagnosis not present

## 2016-02-02 DIAGNOSIS — K219 Gastro-esophageal reflux disease without esophagitis: Secondary | ICD-10-CM | POA: Diagnosis not present

## 2016-02-02 DIAGNOSIS — Z7951 Long term (current) use of inhaled steroids: Secondary | ICD-10-CM | POA: Diagnosis not present

## 2016-02-02 DIAGNOSIS — M7732 Calcaneal spur, left foot: Secondary | ICD-10-CM | POA: Diagnosis not present

## 2016-02-05 DIAGNOSIS — M25562 Pain in left knee: Secondary | ICD-10-CM | POA: Diagnosis not present

## 2016-02-05 DIAGNOSIS — S8002XA Contusion of left knee, initial encounter: Secondary | ICD-10-CM | POA: Diagnosis not present

## 2016-02-05 DIAGNOSIS — M25561 Pain in right knee: Secondary | ICD-10-CM | POA: Diagnosis not present

## 2016-02-05 DIAGNOSIS — I482 Chronic atrial fibrillation: Secondary | ICD-10-CM | POA: Diagnosis not present

## 2016-02-09 DIAGNOSIS — I482 Chronic atrial fibrillation: Secondary | ICD-10-CM | POA: Diagnosis not present

## 2016-02-11 ENCOUNTER — Ambulatory Visit (INDEPENDENT_AMBULATORY_CARE_PROVIDER_SITE_OTHER): Payer: Self-pay | Admitting: Otolaryngology

## 2016-02-16 DIAGNOSIS — L03116 Cellulitis of left lower limb: Secondary | ICD-10-CM | POA: Diagnosis not present

## 2016-02-16 DIAGNOSIS — L03317 Cellulitis of buttock: Secondary | ICD-10-CM | POA: Diagnosis not present

## 2016-02-17 DIAGNOSIS — D62 Acute posthemorrhagic anemia: Secondary | ICD-10-CM | POA: Diagnosis not present

## 2016-02-17 DIAGNOSIS — J449 Chronic obstructive pulmonary disease, unspecified: Secondary | ICD-10-CM | POA: Diagnosis not present

## 2016-02-17 DIAGNOSIS — Z6841 Body Mass Index (BMI) 40.0 and over, adult: Secondary | ICD-10-CM | POA: Diagnosis not present

## 2016-02-17 DIAGNOSIS — L03116 Cellulitis of left lower limb: Secondary | ICD-10-CM | POA: Diagnosis not present

## 2016-02-17 DIAGNOSIS — W19XXXA Unspecified fall, initial encounter: Secondary | ICD-10-CM | POA: Diagnosis not present

## 2016-02-17 DIAGNOSIS — S7012XA Contusion of left thigh, initial encounter: Secondary | ICD-10-CM | POA: Diagnosis not present

## 2016-02-17 DIAGNOSIS — S8012XA Contusion of left lower leg, initial encounter: Secondary | ICD-10-CM | POA: Diagnosis not present

## 2016-02-17 DIAGNOSIS — I482 Chronic atrial fibrillation: Secondary | ICD-10-CM | POA: Diagnosis not present

## 2016-02-17 DIAGNOSIS — Z7901 Long term (current) use of anticoagulants: Secondary | ICD-10-CM | POA: Diagnosis not present

## 2016-02-18 DIAGNOSIS — S8012XA Contusion of left lower leg, initial encounter: Secondary | ICD-10-CM | POA: Diagnosis not present

## 2016-02-18 DIAGNOSIS — L03116 Cellulitis of left lower limb: Secondary | ICD-10-CM | POA: Diagnosis not present

## 2016-02-19 DIAGNOSIS — L03116 Cellulitis of left lower limb: Secondary | ICD-10-CM | POA: Diagnosis not present

## 2016-02-20 DIAGNOSIS — L03116 Cellulitis of left lower limb: Secondary | ICD-10-CM | POA: Diagnosis not present

## 2016-02-24 DIAGNOSIS — J441 Chronic obstructive pulmonary disease with (acute) exacerbation: Secondary | ICD-10-CM | POA: Diagnosis not present

## 2016-02-24 DIAGNOSIS — I482 Chronic atrial fibrillation: Secondary | ICD-10-CM | POA: Diagnosis not present

## 2016-02-24 DIAGNOSIS — I809 Phlebitis and thrombophlebitis of unspecified site: Secondary | ICD-10-CM | POA: Diagnosis not present

## 2016-02-24 DIAGNOSIS — L03116 Cellulitis of left lower limb: Secondary | ICD-10-CM | POA: Diagnosis not present

## 2016-02-24 DIAGNOSIS — Z7901 Long term (current) use of anticoagulants: Secondary | ICD-10-CM | POA: Diagnosis not present

## 2016-03-10 DIAGNOSIS — I809 Phlebitis and thrombophlebitis of unspecified site: Secondary | ICD-10-CM | POA: Diagnosis not present

## 2016-03-10 DIAGNOSIS — R05 Cough: Secondary | ICD-10-CM | POA: Diagnosis not present

## 2016-03-10 DIAGNOSIS — I482 Chronic atrial fibrillation: Secondary | ICD-10-CM | POA: Diagnosis not present

## 2016-03-10 DIAGNOSIS — I10 Essential (primary) hypertension: Secondary | ICD-10-CM | POA: Diagnosis not present

## 2016-03-10 DIAGNOSIS — D649 Anemia, unspecified: Secondary | ICD-10-CM | POA: Diagnosis not present

## 2016-03-10 DIAGNOSIS — J441 Chronic obstructive pulmonary disease with (acute) exacerbation: Secondary | ICD-10-CM | POA: Diagnosis not present

## 2016-03-10 DIAGNOSIS — R062 Wheezing: Secondary | ICD-10-CM | POA: Diagnosis not present

## 2016-03-10 DIAGNOSIS — R609 Edema, unspecified: Secondary | ICD-10-CM | POA: Diagnosis not present

## 2016-03-14 DIAGNOSIS — E7800 Pure hypercholesterolemia, unspecified: Secondary | ICD-10-CM | POA: Diagnosis not present

## 2016-03-14 DIAGNOSIS — J440 Chronic obstructive pulmonary disease with acute lower respiratory infection: Secondary | ICD-10-CM | POA: Diagnosis not present

## 2016-03-14 DIAGNOSIS — I482 Chronic atrial fibrillation: Secondary | ICD-10-CM | POA: Diagnosis not present

## 2016-03-14 DIAGNOSIS — Z79891 Long term (current) use of opiate analgesic: Secondary | ICD-10-CM | POA: Diagnosis not present

## 2016-03-14 DIAGNOSIS — I872 Venous insufficiency (chronic) (peripheral): Secondary | ICD-10-CM | POA: Diagnosis not present

## 2016-03-14 DIAGNOSIS — R0602 Shortness of breath: Secondary | ICD-10-CM | POA: Diagnosis not present

## 2016-03-14 DIAGNOSIS — J9622 Acute and chronic respiratory failure with hypercapnia: Secondary | ICD-10-CM | POA: Diagnosis not present

## 2016-03-14 DIAGNOSIS — Z79899 Other long term (current) drug therapy: Secondary | ICD-10-CM | POA: Diagnosis not present

## 2016-03-14 DIAGNOSIS — J9621 Acute and chronic respiratory failure with hypoxia: Secondary | ICD-10-CM | POA: Diagnosis not present

## 2016-03-14 DIAGNOSIS — Z7951 Long term (current) use of inhaled steroids: Secondary | ICD-10-CM | POA: Diagnosis not present

## 2016-03-14 DIAGNOSIS — Z86718 Personal history of other venous thrombosis and embolism: Secondary | ICD-10-CM | POA: Diagnosis not present

## 2016-03-14 DIAGNOSIS — Z86711 Personal history of pulmonary embolism: Secondary | ICD-10-CM | POA: Diagnosis not present

## 2016-03-14 DIAGNOSIS — Z7982 Long term (current) use of aspirin: Secondary | ICD-10-CM | POA: Diagnosis not present

## 2016-03-14 DIAGNOSIS — R0902 Hypoxemia: Secondary | ICD-10-CM | POA: Diagnosis not present

## 2016-03-14 DIAGNOSIS — J441 Chronic obstructive pulmonary disease with (acute) exacerbation: Secondary | ICD-10-CM | POA: Diagnosis not present

## 2016-03-14 DIAGNOSIS — J189 Pneumonia, unspecified organism: Secondary | ICD-10-CM | POA: Diagnosis not present

## 2016-03-14 DIAGNOSIS — R05 Cough: Secondary | ICD-10-CM | POA: Diagnosis not present

## 2016-03-14 DIAGNOSIS — Z85828 Personal history of other malignant neoplasm of skin: Secondary | ICD-10-CM | POA: Diagnosis not present

## 2016-03-14 DIAGNOSIS — Z6841 Body Mass Index (BMI) 40.0 and over, adult: Secondary | ICD-10-CM | POA: Diagnosis not present

## 2016-03-14 DIAGNOSIS — K219 Gastro-esophageal reflux disease without esophagitis: Secondary | ICD-10-CM | POA: Diagnosis not present

## 2016-03-14 DIAGNOSIS — Z888 Allergy status to other drugs, medicaments and biological substances status: Secondary | ICD-10-CM | POA: Diagnosis not present

## 2016-03-15 DIAGNOSIS — R0902 Hypoxemia: Secondary | ICD-10-CM | POA: Diagnosis not present

## 2016-03-15 DIAGNOSIS — R05 Cough: Secondary | ICD-10-CM | POA: Diagnosis not present

## 2016-03-15 DIAGNOSIS — J441 Chronic obstructive pulmonary disease with (acute) exacerbation: Secondary | ICD-10-CM | POA: Diagnosis not present

## 2016-03-15 DIAGNOSIS — J189 Pneumonia, unspecified organism: Secondary | ICD-10-CM | POA: Diagnosis not present

## 2016-03-16 DIAGNOSIS — J441 Chronic obstructive pulmonary disease with (acute) exacerbation: Secondary | ICD-10-CM | POA: Diagnosis not present

## 2016-03-17 DIAGNOSIS — J441 Chronic obstructive pulmonary disease with (acute) exacerbation: Secondary | ICD-10-CM | POA: Diagnosis not present

## 2016-03-17 DIAGNOSIS — J189 Pneumonia, unspecified organism: Secondary | ICD-10-CM | POA: Diagnosis not present

## 2016-03-20 DIAGNOSIS — J9621 Acute and chronic respiratory failure with hypoxia: Secondary | ICD-10-CM | POA: Diagnosis not present

## 2016-03-20 DIAGNOSIS — Z9981 Dependence on supplemental oxygen: Secondary | ICD-10-CM | POA: Diagnosis not present

## 2016-03-20 DIAGNOSIS — I482 Chronic atrial fibrillation: Secondary | ICD-10-CM | POA: Diagnosis not present

## 2016-03-20 DIAGNOSIS — J45909 Unspecified asthma, uncomplicated: Secondary | ICD-10-CM | POA: Diagnosis not present

## 2016-03-20 DIAGNOSIS — S80911D Unspecified superficial injury of right knee, subsequent encounter: Secondary | ICD-10-CM | POA: Diagnosis not present

## 2016-03-20 DIAGNOSIS — J441 Chronic obstructive pulmonary disease with (acute) exacerbation: Secondary | ICD-10-CM | POA: Diagnosis not present

## 2016-03-20 DIAGNOSIS — J9622 Acute and chronic respiratory failure with hypercapnia: Secondary | ICD-10-CM | POA: Diagnosis not present

## 2016-03-20 DIAGNOSIS — Z86711 Personal history of pulmonary embolism: Secondary | ICD-10-CM | POA: Diagnosis not present

## 2016-03-22 DIAGNOSIS — J441 Chronic obstructive pulmonary disease with (acute) exacerbation: Secondary | ICD-10-CM | POA: Diagnosis not present

## 2016-03-22 DIAGNOSIS — I809 Phlebitis and thrombophlebitis of unspecified site: Secondary | ICD-10-CM | POA: Diagnosis not present

## 2016-03-22 DIAGNOSIS — I482 Chronic atrial fibrillation: Secondary | ICD-10-CM | POA: Diagnosis not present

## 2016-03-22 DIAGNOSIS — I8390 Asymptomatic varicose veins of unspecified lower extremity: Secondary | ICD-10-CM | POA: Diagnosis not present

## 2016-03-22 DIAGNOSIS — I10 Essential (primary) hypertension: Secondary | ICD-10-CM | POA: Diagnosis not present

## 2016-03-23 DIAGNOSIS — Z9981 Dependence on supplemental oxygen: Secondary | ICD-10-CM | POA: Diagnosis not present

## 2016-03-23 DIAGNOSIS — I482 Chronic atrial fibrillation: Secondary | ICD-10-CM | POA: Diagnosis not present

## 2016-03-23 DIAGNOSIS — J9622 Acute and chronic respiratory failure with hypercapnia: Secondary | ICD-10-CM | POA: Diagnosis not present

## 2016-03-23 DIAGNOSIS — J9621 Acute and chronic respiratory failure with hypoxia: Secondary | ICD-10-CM | POA: Diagnosis not present

## 2016-03-23 DIAGNOSIS — J441 Chronic obstructive pulmonary disease with (acute) exacerbation: Secondary | ICD-10-CM | POA: Diagnosis not present

## 2016-03-23 DIAGNOSIS — Z86711 Personal history of pulmonary embolism: Secondary | ICD-10-CM | POA: Diagnosis not present

## 2016-03-23 DIAGNOSIS — S80911D Unspecified superficial injury of right knee, subsequent encounter: Secondary | ICD-10-CM | POA: Diagnosis not present

## 2016-03-23 DIAGNOSIS — J45909 Unspecified asthma, uncomplicated: Secondary | ICD-10-CM | POA: Diagnosis not present

## 2016-03-25 DIAGNOSIS — I482 Chronic atrial fibrillation: Secondary | ICD-10-CM | POA: Diagnosis not present

## 2016-04-06 DIAGNOSIS — I809 Phlebitis and thrombophlebitis of unspecified site: Secondary | ICD-10-CM | POA: Diagnosis not present

## 2016-04-06 DIAGNOSIS — L03116 Cellulitis of left lower limb: Secondary | ICD-10-CM | POA: Diagnosis not present

## 2016-04-06 DIAGNOSIS — I4891 Unspecified atrial fibrillation: Secondary | ICD-10-CM | POA: Diagnosis not present

## 2016-04-06 DIAGNOSIS — J441 Chronic obstructive pulmonary disease with (acute) exacerbation: Secondary | ICD-10-CM | POA: Diagnosis not present

## 2016-04-16 DIAGNOSIS — J189 Pneumonia, unspecified organism: Secondary | ICD-10-CM | POA: Diagnosis not present

## 2016-04-16 DIAGNOSIS — J441 Chronic obstructive pulmonary disease with (acute) exacerbation: Secondary | ICD-10-CM | POA: Diagnosis not present

## 2016-05-17 DIAGNOSIS — I482 Chronic atrial fibrillation: Secondary | ICD-10-CM | POA: Diagnosis not present

## 2016-05-17 DIAGNOSIS — J441 Chronic obstructive pulmonary disease with (acute) exacerbation: Secondary | ICD-10-CM | POA: Diagnosis not present

## 2016-05-17 DIAGNOSIS — J189 Pneumonia, unspecified organism: Secondary | ICD-10-CM | POA: Diagnosis not present

## 2016-06-14 DIAGNOSIS — I482 Chronic atrial fibrillation: Secondary | ICD-10-CM | POA: Diagnosis not present

## 2016-06-16 DIAGNOSIS — J189 Pneumonia, unspecified organism: Secondary | ICD-10-CM | POA: Diagnosis not present

## 2016-06-16 DIAGNOSIS — J441 Chronic obstructive pulmonary disease with (acute) exacerbation: Secondary | ICD-10-CM | POA: Diagnosis not present

## 2016-07-08 DIAGNOSIS — N471 Phimosis: Secondary | ICD-10-CM | POA: Diagnosis not present

## 2016-07-13 DIAGNOSIS — I482 Chronic atrial fibrillation: Secondary | ICD-10-CM | POA: Diagnosis not present

## 2016-07-27 DIAGNOSIS — J439 Emphysema, unspecified: Secondary | ICD-10-CM | POA: Diagnosis not present

## 2016-07-27 DIAGNOSIS — E669 Obesity, unspecified: Secondary | ICD-10-CM | POA: Diagnosis not present

## 2016-07-27 DIAGNOSIS — Z85850 Personal history of malignant neoplasm of thyroid: Secondary | ICD-10-CM | POA: Diagnosis not present

## 2016-07-27 DIAGNOSIS — M069 Rheumatoid arthritis, unspecified: Secondary | ICD-10-CM | POA: Diagnosis not present

## 2016-07-27 DIAGNOSIS — Z86718 Personal history of other venous thrombosis and embolism: Secondary | ICD-10-CM | POA: Diagnosis not present

## 2016-07-27 DIAGNOSIS — J45909 Unspecified asthma, uncomplicated: Secondary | ICD-10-CM | POA: Diagnosis not present

## 2016-07-27 DIAGNOSIS — J449 Chronic obstructive pulmonary disease, unspecified: Secondary | ICD-10-CM | POA: Diagnosis not present

## 2016-07-27 DIAGNOSIS — E7800 Pure hypercholesterolemia, unspecified: Secondary | ICD-10-CM | POA: Diagnosis not present

## 2016-07-27 DIAGNOSIS — K219 Gastro-esophageal reflux disease without esophagitis: Secondary | ICD-10-CM | POA: Diagnosis not present

## 2016-07-27 DIAGNOSIS — I4891 Unspecified atrial fibrillation: Secondary | ICD-10-CM | POA: Diagnosis not present

## 2016-07-27 DIAGNOSIS — Z6841 Body Mass Index (BMI) 40.0 and over, adult: Secondary | ICD-10-CM | POA: Diagnosis not present

## 2016-07-27 DIAGNOSIS — Z7901 Long term (current) use of anticoagulants: Secondary | ICD-10-CM | POA: Diagnosis not present

## 2016-07-27 DIAGNOSIS — Z79899 Other long term (current) drug therapy: Secondary | ICD-10-CM | POA: Diagnosis not present

## 2016-07-27 DIAGNOSIS — E785 Hyperlipidemia, unspecified: Secondary | ICD-10-CM | POA: Diagnosis not present

## 2016-07-27 DIAGNOSIS — Z7952 Long term (current) use of systemic steroids: Secondary | ICD-10-CM | POA: Diagnosis not present

## 2016-07-27 DIAGNOSIS — N471 Phimosis: Secondary | ICD-10-CM | POA: Diagnosis not present

## 2016-08-05 DIAGNOSIS — Z6841 Body Mass Index (BMI) 40.0 and over, adult: Secondary | ICD-10-CM | POA: Diagnosis not present

## 2016-08-05 DIAGNOSIS — I82409 Acute embolism and thrombosis of unspecified deep veins of unspecified lower extremity: Secondary | ICD-10-CM | POA: Diagnosis not present

## 2016-08-05 DIAGNOSIS — M1731 Unilateral post-traumatic osteoarthritis, right knee: Secondary | ICD-10-CM | POA: Diagnosis not present

## 2016-08-05 DIAGNOSIS — I482 Chronic atrial fibrillation: Secondary | ICD-10-CM | POA: Diagnosis not present

## 2016-08-05 DIAGNOSIS — I10 Essential (primary) hypertension: Secondary | ICD-10-CM | POA: Diagnosis not present

## 2016-08-09 DIAGNOSIS — B351 Tinea unguium: Secondary | ICD-10-CM | POA: Diagnosis not present

## 2016-08-09 DIAGNOSIS — M79676 Pain in unspecified toe(s): Secondary | ICD-10-CM | POA: Diagnosis not present

## 2016-08-12 DIAGNOSIS — I482 Chronic atrial fibrillation: Secondary | ICD-10-CM | POA: Diagnosis not present

## 2016-08-23 DIAGNOSIS — M13161 Monoarthritis, not elsewhere classified, right knee: Secondary | ICD-10-CM | POA: Diagnosis not present

## 2016-08-23 DIAGNOSIS — M7631 Iliotibial band syndrome, right leg: Secondary | ICD-10-CM | POA: Diagnosis not present

## 2016-08-23 DIAGNOSIS — M25561 Pain in right knee: Secondary | ICD-10-CM | POA: Diagnosis not present

## 2016-09-12 DIAGNOSIS — Z23 Encounter for immunization: Secondary | ICD-10-CM | POA: Diagnosis not present

## 2016-09-12 DIAGNOSIS — I482 Chronic atrial fibrillation: Secondary | ICD-10-CM | POA: Diagnosis not present

## 2016-09-26 DIAGNOSIS — I482 Chronic atrial fibrillation: Secondary | ICD-10-CM | POA: Diagnosis not present

## 2016-10-13 DIAGNOSIS — L03031 Cellulitis of right toe: Secondary | ICD-10-CM | POA: Diagnosis not present

## 2016-10-17 DIAGNOSIS — D485 Neoplasm of uncertain behavior of skin: Secondary | ICD-10-CM | POA: Diagnosis not present

## 2016-10-17 DIAGNOSIS — Z6841 Body Mass Index (BMI) 40.0 and over, adult: Secondary | ICD-10-CM | POA: Diagnosis not present

## 2016-10-18 DIAGNOSIS — N200 Calculus of kidney: Secondary | ICD-10-CM | POA: Diagnosis not present

## 2016-10-18 DIAGNOSIS — R350 Frequency of micturition: Secondary | ICD-10-CM | POA: Diagnosis not present

## 2016-10-18 DIAGNOSIS — Z126 Encounter for screening for malignant neoplasm of bladder: Secondary | ICD-10-CM | POA: Diagnosis not present

## 2016-10-18 DIAGNOSIS — R310 Gross hematuria: Secondary | ICD-10-CM | POA: Diagnosis not present

## 2016-10-19 DIAGNOSIS — R350 Frequency of micturition: Secondary | ICD-10-CM | POA: Diagnosis not present

## 2016-10-19 DIAGNOSIS — R310 Gross hematuria: Secondary | ICD-10-CM | POA: Diagnosis not present

## 2016-10-25 DIAGNOSIS — L03031 Cellulitis of right toe: Secondary | ICD-10-CM | POA: Diagnosis not present

## 2016-10-25 DIAGNOSIS — I700 Atherosclerosis of aorta: Secondary | ICD-10-CM | POA: Diagnosis not present

## 2016-10-25 DIAGNOSIS — R310 Gross hematuria: Secondary | ICD-10-CM | POA: Diagnosis not present

## 2016-10-25 DIAGNOSIS — K5730 Diverticulosis of large intestine without perforation or abscess without bleeding: Secondary | ICD-10-CM | POA: Diagnosis not present

## 2016-10-25 DIAGNOSIS — N200 Calculus of kidney: Secondary | ICD-10-CM | POA: Diagnosis not present

## 2016-10-25 DIAGNOSIS — Z9049 Acquired absence of other specified parts of digestive tract: Secondary | ICD-10-CM | POA: Diagnosis not present

## 2016-10-28 DIAGNOSIS — I482 Chronic atrial fibrillation: Secondary | ICD-10-CM | POA: Diagnosis not present

## 2016-11-07 DIAGNOSIS — Z6841 Body Mass Index (BMI) 40.0 and over, adult: Secondary | ICD-10-CM | POA: Diagnosis not present

## 2016-11-07 DIAGNOSIS — J0101 Acute recurrent maxillary sinusitis: Secondary | ICD-10-CM | POA: Diagnosis not present

## 2016-11-16 DIAGNOSIS — J439 Emphysema, unspecified: Secondary | ICD-10-CM | POA: Diagnosis not present

## 2016-11-16 DIAGNOSIS — Z6841 Body Mass Index (BMI) 40.0 and over, adult: Secondary | ICD-10-CM | POA: Diagnosis not present

## 2016-11-16 DIAGNOSIS — N200 Calculus of kidney: Secondary | ICD-10-CM | POA: Diagnosis not present

## 2016-11-16 DIAGNOSIS — E7800 Pure hypercholesterolemia, unspecified: Secondary | ICD-10-CM | POA: Diagnosis not present

## 2016-11-16 DIAGNOSIS — E669 Obesity, unspecified: Secondary | ICD-10-CM | POA: Diagnosis not present

## 2016-11-16 DIAGNOSIS — J45909 Unspecified asthma, uncomplicated: Secondary | ICD-10-CM | POA: Diagnosis not present

## 2016-11-16 DIAGNOSIS — K219 Gastro-esophageal reflux disease without esophagitis: Secondary | ICD-10-CM | POA: Diagnosis not present

## 2016-11-16 DIAGNOSIS — Z9049 Acquired absence of other specified parts of digestive tract: Secondary | ICD-10-CM | POA: Diagnosis not present

## 2016-11-16 DIAGNOSIS — Z7952 Long term (current) use of systemic steroids: Secondary | ICD-10-CM | POA: Diagnosis not present

## 2016-11-16 DIAGNOSIS — Z85850 Personal history of malignant neoplasm of thyroid: Secondary | ICD-10-CM | POA: Diagnosis not present

## 2016-11-16 DIAGNOSIS — Z7901 Long term (current) use of anticoagulants: Secondary | ICD-10-CM | POA: Diagnosis not present

## 2016-11-16 DIAGNOSIS — Z79899 Other long term (current) drug therapy: Secondary | ICD-10-CM | POA: Diagnosis not present

## 2016-11-16 DIAGNOSIS — Z86718 Personal history of other venous thrombosis and embolism: Secondary | ICD-10-CM | POA: Diagnosis not present

## 2016-11-25 DIAGNOSIS — I482 Chronic atrial fibrillation: Secondary | ICD-10-CM | POA: Diagnosis not present

## 2016-12-06 DIAGNOSIS — N200 Calculus of kidney: Secondary | ICD-10-CM | POA: Diagnosis not present

## 2016-12-06 DIAGNOSIS — Z960 Presence of urogenital implants: Secondary | ICD-10-CM | POA: Diagnosis not present

## 2016-12-07 DIAGNOSIS — Z961 Presence of intraocular lens: Secondary | ICD-10-CM | POA: Diagnosis not present

## 2016-12-07 DIAGNOSIS — Z96 Presence of other functional implants: Secondary | ICD-10-CM | POA: Diagnosis not present

## 2016-12-14 DIAGNOSIS — Z6841 Body Mass Index (BMI) 40.0 and over, adult: Secondary | ICD-10-CM | POA: Diagnosis not present

## 2016-12-14 DIAGNOSIS — I4891 Unspecified atrial fibrillation: Secondary | ICD-10-CM | POA: Diagnosis not present

## 2016-12-14 DIAGNOSIS — Z7952 Long term (current) use of systemic steroids: Secondary | ICD-10-CM | POA: Diagnosis not present

## 2016-12-14 DIAGNOSIS — Z79899 Other long term (current) drug therapy: Secondary | ICD-10-CM | POA: Diagnosis not present

## 2016-12-14 DIAGNOSIS — Z87442 Personal history of urinary calculi: Secondary | ICD-10-CM | POA: Diagnosis not present

## 2016-12-14 DIAGNOSIS — Z85850 Personal history of malignant neoplasm of thyroid: Secondary | ICD-10-CM | POA: Diagnosis not present

## 2016-12-14 DIAGNOSIS — Z472 Encounter for removal of internal fixation device: Secondary | ICD-10-CM | POA: Diagnosis not present

## 2016-12-14 DIAGNOSIS — E7800 Pure hypercholesterolemia, unspecified: Secondary | ICD-10-CM | POA: Diagnosis not present

## 2016-12-14 DIAGNOSIS — K219 Gastro-esophageal reflux disease without esophagitis: Secondary | ICD-10-CM | POA: Diagnosis not present

## 2016-12-14 DIAGNOSIS — J449 Chronic obstructive pulmonary disease, unspecified: Secondary | ICD-10-CM | POA: Diagnosis not present

## 2016-12-14 DIAGNOSIS — Z7901 Long term (current) use of anticoagulants: Secondary | ICD-10-CM | POA: Diagnosis not present

## 2016-12-14 DIAGNOSIS — E785 Hyperlipidemia, unspecified: Secondary | ICD-10-CM | POA: Diagnosis not present

## 2016-12-14 DIAGNOSIS — Z9049 Acquired absence of other specified parts of digestive tract: Secondary | ICD-10-CM | POA: Diagnosis not present

## 2016-12-14 DIAGNOSIS — J45909 Unspecified asthma, uncomplicated: Secondary | ICD-10-CM | POA: Diagnosis not present

## 2016-12-14 DIAGNOSIS — Z86718 Personal history of other venous thrombosis and embolism: Secondary | ICD-10-CM | POA: Diagnosis not present

## 2016-12-14 DIAGNOSIS — Z960 Presence of urogenital implants: Secondary | ICD-10-CM | POA: Diagnosis not present

## 2016-12-14 DIAGNOSIS — N200 Calculus of kidney: Secondary | ICD-10-CM | POA: Diagnosis not present

## 2016-12-15 DIAGNOSIS — I82409 Acute embolism and thrombosis of unspecified deep veins of unspecified lower extremity: Secondary | ICD-10-CM | POA: Diagnosis not present

## 2016-12-30 DIAGNOSIS — R5383 Other fatigue: Secondary | ICD-10-CM | POA: Diagnosis not present

## 2016-12-30 DIAGNOSIS — I482 Chronic atrial fibrillation: Secondary | ICD-10-CM | POA: Diagnosis not present

## 2016-12-30 DIAGNOSIS — R110 Nausea: Secondary | ICD-10-CM | POA: Diagnosis not present

## 2016-12-30 DIAGNOSIS — K1379 Other lesions of oral mucosa: Secondary | ICD-10-CM | POA: Diagnosis not present

## 2016-12-30 DIAGNOSIS — Z6841 Body Mass Index (BMI) 40.0 and over, adult: Secondary | ICD-10-CM | POA: Diagnosis not present

## 2016-12-31 DIAGNOSIS — R1084 Generalized abdominal pain: Secondary | ICD-10-CM | POA: Diagnosis not present

## 2016-12-31 DIAGNOSIS — Z86718 Personal history of other venous thrombosis and embolism: Secondary | ICD-10-CM | POA: Diagnosis not present

## 2016-12-31 DIAGNOSIS — R112 Nausea with vomiting, unspecified: Secondary | ICD-10-CM | POA: Diagnosis not present

## 2016-12-31 DIAGNOSIS — K219 Gastro-esophageal reflux disease without esophagitis: Secondary | ICD-10-CM | POA: Diagnosis not present

## 2016-12-31 DIAGNOSIS — E7800 Pure hypercholesterolemia, unspecified: Secondary | ICD-10-CM | POA: Diagnosis not present

## 2016-12-31 DIAGNOSIS — R109 Unspecified abdominal pain: Secondary | ICD-10-CM | POA: Diagnosis not present

## 2016-12-31 DIAGNOSIS — J449 Chronic obstructive pulmonary disease, unspecified: Secondary | ICD-10-CM | POA: Diagnosis not present

## 2016-12-31 DIAGNOSIS — Z86711 Personal history of pulmonary embolism: Secondary | ICD-10-CM | POA: Diagnosis not present

## 2016-12-31 DIAGNOSIS — Z79899 Other long term (current) drug therapy: Secondary | ICD-10-CM | POA: Diagnosis not present

## 2016-12-31 DIAGNOSIS — Z7951 Long term (current) use of inhaled steroids: Secondary | ICD-10-CM | POA: Diagnosis not present

## 2016-12-31 DIAGNOSIS — Z7901 Long term (current) use of anticoagulants: Secondary | ICD-10-CM | POA: Diagnosis not present

## 2016-12-31 DIAGNOSIS — K5730 Diverticulosis of large intestine without perforation or abscess without bleeding: Secondary | ICD-10-CM | POA: Diagnosis not present

## 2016-12-31 DIAGNOSIS — K5790 Diverticulosis of intestine, part unspecified, without perforation or abscess without bleeding: Secondary | ICD-10-CM | POA: Diagnosis not present

## 2016-12-31 DIAGNOSIS — R1110 Vomiting, unspecified: Secondary | ICD-10-CM | POA: Diagnosis not present

## 2016-12-31 DIAGNOSIS — M1990 Unspecified osteoarthritis, unspecified site: Secondary | ICD-10-CM | POA: Diagnosis not present

## 2017-01-04 DIAGNOSIS — R634 Abnormal weight loss: Secondary | ICD-10-CM | POA: Diagnosis not present

## 2017-01-04 DIAGNOSIS — I482 Chronic atrial fibrillation: Secondary | ICD-10-CM | POA: Diagnosis not present

## 2017-01-04 DIAGNOSIS — R110 Nausea: Secondary | ICD-10-CM | POA: Diagnosis not present

## 2017-01-04 DIAGNOSIS — R109 Unspecified abdominal pain: Secondary | ICD-10-CM | POA: Diagnosis not present

## 2017-01-04 DIAGNOSIS — I10 Essential (primary) hypertension: Secondary | ICD-10-CM | POA: Diagnosis not present

## 2017-01-04 DIAGNOSIS — R1915 Other abnormal bowel sounds: Secondary | ICD-10-CM | POA: Diagnosis not present

## 2017-01-04 DIAGNOSIS — D649 Anemia, unspecified: Secondary | ICD-10-CM | POA: Diagnosis not present

## 2017-01-04 DIAGNOSIS — E7800 Pure hypercholesterolemia, unspecified: Secondary | ICD-10-CM | POA: Diagnosis not present

## 2017-01-09 ENCOUNTER — Encounter: Payer: Self-pay | Admitting: Gastroenterology

## 2017-01-12 ENCOUNTER — Ambulatory Visit (INDEPENDENT_AMBULATORY_CARE_PROVIDER_SITE_OTHER): Payer: PPO | Admitting: Otolaryngology

## 2017-01-12 DIAGNOSIS — I8390 Asymptomatic varicose veins of unspecified lower extremity: Secondary | ICD-10-CM | POA: Diagnosis not present

## 2017-01-12 DIAGNOSIS — Z85850 Personal history of malignant neoplasm of thyroid: Secondary | ICD-10-CM

## 2017-01-12 DIAGNOSIS — D3709 Neoplasm of uncertain behavior of other specified sites of the oral cavity: Secondary | ICD-10-CM

## 2017-01-12 DIAGNOSIS — I482 Chronic atrial fibrillation: Secondary | ICD-10-CM | POA: Diagnosis not present

## 2017-01-13 DIAGNOSIS — N200 Calculus of kidney: Secondary | ICD-10-CM | POA: Diagnosis not present

## 2017-01-17 DIAGNOSIS — R062 Wheezing: Secondary | ICD-10-CM | POA: Diagnosis not present

## 2017-01-17 DIAGNOSIS — Z6841 Body Mass Index (BMI) 40.0 and over, adult: Secondary | ICD-10-CM | POA: Diagnosis not present

## 2017-01-17 DIAGNOSIS — R05 Cough: Secondary | ICD-10-CM | POA: Diagnosis not present

## 2017-01-19 DIAGNOSIS — I809 Phlebitis and thrombophlebitis of unspecified site: Secondary | ICD-10-CM | POA: Diagnosis not present

## 2017-01-19 DIAGNOSIS — J189 Pneumonia, unspecified organism: Secondary | ICD-10-CM | POA: Diagnosis not present

## 2017-01-19 DIAGNOSIS — Z6841 Body Mass Index (BMI) 40.0 and over, adult: Secondary | ICD-10-CM | POA: Diagnosis not present

## 2017-01-19 DIAGNOSIS — I482 Chronic atrial fibrillation: Secondary | ICD-10-CM | POA: Diagnosis not present

## 2017-01-19 DIAGNOSIS — R042 Hemoptysis: Secondary | ICD-10-CM | POA: Diagnosis not present

## 2017-01-21 DIAGNOSIS — I482 Chronic atrial fibrillation: Secondary | ICD-10-CM | POA: Diagnosis not present

## 2017-01-25 DIAGNOSIS — I482 Chronic atrial fibrillation: Secondary | ICD-10-CM | POA: Diagnosis not present

## 2017-01-25 DIAGNOSIS — I82409 Acute embolism and thrombosis of unspecified deep veins of unspecified lower extremity: Secondary | ICD-10-CM | POA: Diagnosis not present

## 2017-01-27 ENCOUNTER — Other Ambulatory Visit: Payer: Self-pay

## 2017-01-27 ENCOUNTER — Encounter: Payer: Self-pay | Admitting: Gastroenterology

## 2017-01-27 ENCOUNTER — Ambulatory Visit (INDEPENDENT_AMBULATORY_CARE_PROVIDER_SITE_OTHER): Payer: Medicare Other | Admitting: Gastroenterology

## 2017-01-27 VITALS — BP 126/80 | HR 76 | Temp 97.6°F | Ht 73.0 in | Wt 297.0 lb

## 2017-01-27 DIAGNOSIS — R1310 Dysphagia, unspecified: Secondary | ICD-10-CM

## 2017-01-27 DIAGNOSIS — R198 Other specified symptoms and signs involving the digestive system and abdomen: Secondary | ICD-10-CM

## 2017-01-27 DIAGNOSIS — R1319 Other dysphagia: Secondary | ICD-10-CM

## 2017-01-27 DIAGNOSIS — K7460 Unspecified cirrhosis of liver: Secondary | ICD-10-CM

## 2017-01-27 DIAGNOSIS — R112 Nausea with vomiting, unspecified: Secondary | ICD-10-CM | POA: Diagnosis not present

## 2017-01-27 DIAGNOSIS — R634 Abnormal weight loss: Secondary | ICD-10-CM

## 2017-01-27 MED ORDER — PEG 3350-KCL-NA BICARB-NACL 420 G PO SOLR
4000.0000 mL | ORAL | 0 refills | Status: DC
Start: 2017-01-27 — End: 2017-03-30

## 2017-01-27 NOTE — Progress Notes (Signed)
Can someone please call patient and make sure med list is accurate? He verbally told me and Dr. Lyman Speller note states he takes coumadin but I don't see it on the med list.

## 2017-01-27 NOTE — Patient Instructions (Signed)
1. Please have your labs done as discussed.  2. Plan for upper endoscopy and colonoscopy as scheduled. See separate instructions.

## 2017-01-27 NOTE — Progress Notes (Addendum)
Primary Care Physician:  Rory Percy, MD  Primary Gastroenterologist:  Garfield Cornea, MD   Chief Complaint  Patient presents with  . Cirrhosis    HPI:  Jim Wilson is a 66 y.o. male here at the request of Dr. Nadara Mustard for further evaluation of nausea/vomiting, weight loss and CT findings suggestive of cirrhosis.   Patient states he weight 380 lb 18 months ago. Down to 297 lb today. Reports 40 pound weight loss in more recent past. Complains of a lot of gas/bloating especially when he eats. Was having frequent N/V. Now on Zofran BID which has helped some. Chronic GERD on omeprazole for more than 10 years. Some solid food esophageal dysphagia. No abdominal pain. BM alternate with constipation and diarrhea. Now on stool softeners and probiotics with some improvement. No melena, brbpr.   Seen last month in the ED with these symptoms. Labs as outlined below. CT abdomen pelvis with contrast compared to November 2017, liver with central volume loss and some surface nodularity raising possibility of cirrhosis. Spleen unremarkable except for benign cyst. Pancreas with fatty replacement. Nothing to explain vomiting or weight loss.  No prior EGD. Remote TCS 2005 by Dr. Anthony Sar.    Current Outpatient Prescriptions  Medication Sig Dispense Refill  . albuterol (PROVENTIL) (2.5 MG/3ML) 0.083% nebulizer solution Take 2.5 mg by nebulization every 6 (six) hours as needed for wheezing or shortness of breath.    . cetirizine (ZYRTEC) 10 MG tablet Take 10 mg by mouth daily.    Marland Kitchen diltiazem (CARDIZEM CD) 120 MG 24 hr capsule Take 120 mg by mouth daily.     . Fluticasone-Salmeterol (ADVAIR) 250-50 MCG/DOSE AEPB Inhale 2 puffs into the lungs daily.     . furosemide (LASIX) 40 MG tablet Take 80 mg by mouth daily.     Marland Kitchen ketoconazole (NIZORAL) 2 % cream Apply 1 application topically 3 (three) times daily as needed for irritation.     Marland Kitchen omeprazole (PRILOSEC) 20 MG capsule Take 20 mg by mouth 2 (two) times daily.      Marland Kitchen     0  . potassium chloride (K-DUR) 10 MEQ tablet Take 10 mEq by mouth daily.     . simvastatin (ZOCOR) 20 MG tablet Take 20 mg by mouth daily.     . tamsulosin (FLOMAX) 0.4 MG CAPS capsule Take 0.4 mg by mouth 2 (two) times daily.     Marland Kitchen warfarin (COUMADIN) 5 MG tablet Take 5 mg by mouth daily.    .        No current facility-administered medications for this visit.     Allergies as of 01/27/2017  . (No Known Allergies)    Past Medical History:  Diagnosis Date  . A-fib ([place])   . Cancer ([place])    hx of skin cancers.   Mom & Dad passed away from lung cancer  . COPD (chronic obstructive pulmonary disease) ([place])   . 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 Dysrhythmia    A-FIB    . GERD (gastroesophageal reflux disease)   . History of hiatal hernia   . Kidney stones   . Shortness of breath dyspnea   . Thyroid cancer ([place]) 2016  . Thyroid mass of unclear etiology    reason for doing this surgery  . Wolff-Parkinson-White (WPW) syndrome    had 2 different ablations.  One here @ [facility], the other at [facility]    Past Surgical History:  Procedure Laterality Date  . ABLATION     for WPW  . bladder surgery 4-5 yrs ago.    . CHOLECYSTECTOMY    . COLONOSCOPY  2005  . EXCISION OF TONGUE LESION    . EYE SURGERY     bilateral cataracts  . HERNIA REPAIR     Inguinal hernia on left  . KNEE ARTHROSCOPY W/ MENISCAL REPAIR     on the right knee  . skin cancer removed from scalp    . THYROIDECTOMY Right 06/03/2015   Procedure: RIGHT HEMI THYROIDECTOMY;  Surgeon: Leta Baptist, MD;  Location: [facility];  Service: ENT;  Laterality: Right;  . TONSILLECTOMY      Family History  Problem Relation Age of Onset  . Lung cancer Mother   . Lung cancer Father   . Liver disease Neg Hx   . Colon cancer Neg Hx     Social History   Social History  . Marital status: Single    Spouse name: N/A  . Number of children: N/A  . Years of education: N/A    Occupational History  . Not on file.   Social History Main Topics  . Smoking status: Never Smoker  . Smokeless tobacco: Never Used  . Alcohol use No  . Drug use: No  . Sexual activity: Not on file   Other Topics Concern  . Not on file   Social History Narrative  . No narrative on file      ROS:  General: Negative for anorexia, fever, chills, fatigue, weakness. See hpi. Eyes: Negative for vision changes.  ENT: Negative for hoarseness,  nasal congestion. See hpi CV: Negative for chest pain, angina, palpitations, dyspnea on exertion, peripheral edema.  Respiratory: Negative for dyspnea at rest, dyspnea on exertion, cough, sputum, wheezing.  GI: See history of present illness. GU:  Negative for dysuria, hematuria, urinary incontinence, urinary frequency, nocturnal urination.  MS: Negative for joint pain, low back pain.  Derm: Negative for rash or itching.  Neuro: Negative for weakness, abnormal sensation, seizure, frequent headaches, memory loss, confusion.  Psych: Negative for anxiety, depression, suicidal ideation, hallucinations.  Endo: see hpi Heme: Negative for bruising or bleeding. Allergy: Negative for rash or hives.    Physical Examination:  BP 126/80   Pulse 76   Temp 97.6 F (36.4 C) (Oral)   Ht '6\' 1"'$  (1.854 m)   Wt 297 lb (134.7 kg)   BMI 39.18 kg/m    General: Well-nourished, well-developed in no acute distress.  Head: Normocephalic, atraumatic.   Eyes: Conjunctiva pink, no icterus. Mouth: Oropharyngeal mucosa moist and pink , no lesions erythema or exudate. Neck: Supple without thyromegaly, masses, or lymphadenopathy.  Lungs: Clear to auscultation bilaterally.  Heart: Regular rate and rhythm, no murmurs rubs or gallops.  Abdomen: Bowel sounds are normal, obese, nontender, small umb hernia easily reducible. no hepatosplenomegaly or masses, no abdominal bruits, no rebound or guarding.   Rectal: not performed Extremities: No lower extremity edema. No  clubbing or deformities.  Neuro: Alert and oriented x 4 , grossly normal neurologically.  Skin: Warm and dry, no rash or jaundice.   Psych: Alert and cooperative, normal mood and affect.  Labs: Labs from 12/31/2016, total bilirubin 1.4, alkaline phosphatase 82, AST 20.3, ALT 12, albumin 3.9, amylase 26, lipase 6, creatinine 0.95, BUN 14, white blood cell count 5200, hemoglobin 14, hematocrit 43, MCV 98.2.  Imaging Studies: CT abdomen pelvis with contrast dated 12/31/2016 at [facility] Mild cardiomegaly, stable lung  base scarring with areas of pleural thickening. Liver shows central volume loss and some surface nodularity, similar to prior CT November 2017. Extensive fatty replacement of the pancreas. 2.7 cm low density oval lesion in the anterior superior spleen consistent with a stable cyst. Small lipoma noted along the gastric antrum, greater curvature, stable. Multiple colonic diverticula along the left: No diverticulitis. Mild dilation of portions of the transverse colon, maximum 6.9 cm. No obstruction. Several calcifications in the mesenteries of the low abdomen and pelvis, chronic and stable from prior CT. Mildly enlarged gastrohepatic ligament lymph node measuring 13 mm. Small fat-containing. Umbilical hernia.  Impression/Plan:  66 year old gentleman presenting for further evaluation of nausea/vomiting, abnormal weight loss, possible cirrhosis. Patient is chronically on Coumadin for A. fib and history of DVTs. He has had significant weight loss as outlined above, nausea and more recently with vomiting. Differential diagnosis includes GERD, gastritis, peptic ulcer disease, gastroparesis. He is on chronic PPI therapy for more than 10 years and needs to be screened for Barrett's esophagus as well. He complains of solid food dysphagia. He has alternating constipation/diarrhea which seems to be less problematic. No melena or rectal bleeding. Due for colonoscopy. Plan on EGD/ED/colonoscopy in  the near future with Dr. Gala Romney. He reports being on Coumadin for chronic A. fib, recurrent DVT. He has used Lovenox bridging in the past. Would recommend this approach as well this time.  I have discussed the risks, alternatives, benefits with regards to but not limited to the risk of reaction to medication, bleeding, infection, perforation and the patient is agreeable to proceed. Written consent to be obtained.  As far as suspected cirrhosis based on CT, he has nodular appearing border on 2 CTs recently. Risk factor includes obesity, likely NASH. EGD will shed light on any potential portal hypertension findings which would also support cirrhosis diagnosis. As far as hepatic function is albumin is normal. MELD cannot be calculated given Coumadin therapy. Obtain further labs. Further recommendations to follow.

## 2017-01-30 ENCOUNTER — Telehealth: Payer: Self-pay

## 2017-01-30 ENCOUNTER — Other Ambulatory Visit: Payer: Self-pay

## 2017-01-30 DIAGNOSIS — R112 Nausea with vomiting, unspecified: Secondary | ICD-10-CM

## 2017-01-30 DIAGNOSIS — R198 Other specified symptoms and signs involving the digestive system and abdomen: Secondary | ICD-10-CM

## 2017-01-30 DIAGNOSIS — R634 Abnormal weight loss: Secondary | ICD-10-CM

## 2017-01-30 DIAGNOSIS — R1319 Other dysphagia: Secondary | ICD-10-CM

## 2017-01-30 DIAGNOSIS — R1310 Dysphagia, unspecified: Secondary | ICD-10-CM

## 2017-01-30 NOTE — Telephone Encounter (Signed)
Jim Wilson, pt is scheduled for TCS/EGD/ED with RMR 02/23/17. Please advise for Lovenox bridge.

## 2017-01-30 NOTE — Telephone Encounter (Signed)
PA info faxed to [facility] for TCS/EGD/ED.

## 2017-01-31 ENCOUNTER — Telehealth: Payer: Self-pay | Admitting: Internal Medicine

## 2017-01-31 NOTE — Telephone Encounter (Signed)
Tried to call pt. [phone] (unable to leave message, no voicemail). LMOVM ([phone]) for pt to call office to verify meds.

## 2017-01-31 NOTE — Telephone Encounter (Signed)
Pt said he had labs done at [facility] and was asking if his results were available. Please call [phone]

## 2017-01-31 NOTE — Progress Notes (Signed)
cc'ed to pcp °

## 2017-01-31 NOTE — Telephone Encounter (Signed)
Before I give you a Lovenox bridge, please call patient and verify his med list. HE TOLD ME HE WAS TAKING COUMADIN BUT IT IS NOT ON HIS MED LIST. PLEASE GO THRU ALL OF HIS MED. THANKS!

## 2017-01-31 NOTE — Telephone Encounter (Signed)
Medication list updated.

## 2017-01-31 NOTE — Telephone Encounter (Signed)
Pt called the office. Verified medication list. He takes Omeprazole 20 mg po BID and Coumadin 5 mg po daily. All other meds are correct.

## 2017-01-31 NOTE — Progress Notes (Signed)
Talked to pt. Updated med list. He takes Coumadin 5 mg po daily. Omeprazole 20 mg is  BID. All other meds were correct.

## 2017-02-01 MED ORDER — ENOXAPARIN SODIUM 150 MG/ML ~~LOC~~ SOLN: 1.0000 mg/kg | Freq: Two times a day (BID) | SUBCUTANEOUS | 0 refills | Status: DC

## 2017-02-01 NOTE — Telephone Encounter (Signed)
Called and informed pt of Coumadin/Lovenox instructions. Instructions also attached to letter and mailed. Advised pt to pick-up Lovenox as soon as possible.

## 2017-02-01 NOTE — Telephone Encounter (Signed)
We have not gotten any labs. Please request them.

## 2017-02-01 NOTE — Telephone Encounter (Signed)
Requested any recent labs from [facility]

## 2017-02-01 NOTE — Telephone Encounter (Signed)
Please follow the Medication Instructions below for your procedure:  You need to hold your coumadin four days before your procedure and start a Lovenox bridge see table below.  Your procedure is scheduled for 02/23/17. RX for Lovenox has been sent to your pharmacy. Please pick up as soon as possible in case they have to order it, etc.     02/18/17 Last dose of warfarin   02/19/17 No Coumadin/warfarin   02/20/17 No Coumadin/warfarin  Begin Lovenox/enoxaparin [place] '135mg'$  at 10am and '135mg'$  at 10pm   02/21/17 No Coumadin/warfarin Lovenox/enoxaparin [place] '135mg'$  at10am and '135mg'$  at10pm  02/22/17 No Coumadin/warfarin Lovenox/enoxaparin [place] '135mg'$  at 10amand '135mg'$  at 10pm  02/23/17 Day of procedue No Coumadin/warfarin No Lovenox/enoxaparin  Dr. Gala Romney will provide you with further instructions about your coumadin and Lovenox after your procedure.     You will be instructed when to get your next INR checked at the coumadin clinic.

## 2017-02-01 NOTE — Progress Notes (Signed)
Med list updated within contents of note.

## 2017-02-01 NOTE — Progress Notes (Signed)
Pt is aware, I have mailed him rx for hep A and B vaccines. He is aware of where to take them. He will contact his pcp about his blood glucose.

## 2017-02-01 NOTE — Telephone Encounter (Signed)
Spoke with the pt. See addendum

## 2017-02-01 NOTE — Telephone Encounter (Signed)
LABS RECEIVED AND ADDENDUM TO OV NOTE MADE WITH INSTRUCTIONS. PLEASE REFER TO OV NOTE ADDENDUM.

## 2017-02-01 NOTE — Progress Notes (Signed)
Labs dated 01/27/2017 Ferritin 65, normal. Total hepatitis A antibody negative. Hepatitis B surface antigen negative. Hepatitis B surface antibody nonreactive. Hepatitis C antibody less than 0.1 negative. Glucose 151, BUN 17, creatinine 1.05, calcium 8.4, total bilirubin 0.8, alkaline phosphatase 103, AST 19, ALT 25, iron 68, TIBC 346, iron saturations 20%, white blood cell count 7200, hemoglobin 15.1, hematocrit 45.5, MCV 95.2, platelets 176,000  Please let patient know that he does not have hepatitis B or C. No evidence of hemochromatosis. Hepatic function appears good and no evidence of portal hypertension based on normal platelet count, normal albumin, need for coumadin to elevate inr.   At this point, proceed with EGD/TCS as planned.   I would recommend Hepatitis A and B vaccination. Please arrange.   HE DOES HAVE AN ELEVATED GLUCOSE OF 151 EVEN IN A NONFASTING STATE THIS IS ABNORMAL. NEEDS TO FOLLOW UP WITH PCP TO RULE OUT DIABETES.

## 2017-02-05 DIAGNOSIS — K5730 Diverticulosis of large intestine without perforation or abscess without bleeding: Secondary | ICD-10-CM | POA: Diagnosis not present

## 2017-02-05 DIAGNOSIS — E669 Obesity, unspecified: Secondary | ICD-10-CM | POA: Diagnosis not present

## 2017-02-05 DIAGNOSIS — J449 Chronic obstructive pulmonary disease, unspecified: Secondary | ICD-10-CM | POA: Diagnosis not present

## 2017-02-05 DIAGNOSIS — N200 Calculus of kidney: Secondary | ICD-10-CM | POA: Diagnosis not present

## 2017-02-05 DIAGNOSIS — Z79899 Other long term (current) drug therapy: Secondary | ICD-10-CM | POA: Diagnosis not present

## 2017-02-05 DIAGNOSIS — I4891 Unspecified atrial fibrillation: Secondary | ICD-10-CM | POA: Diagnosis not present

## 2017-02-05 DIAGNOSIS — R1084 Generalized abdominal pain: Secondary | ICD-10-CM | POA: Diagnosis not present

## 2017-02-05 DIAGNOSIS — Z86711 Personal history of pulmonary embolism: Secondary | ICD-10-CM | POA: Diagnosis not present

## 2017-02-05 DIAGNOSIS — Z85828 Personal history of other malignant neoplasm of skin: Secondary | ICD-10-CM | POA: Diagnosis not present

## 2017-02-05 DIAGNOSIS — K219 Gastro-esophageal reflux disease without esophagitis: Secondary | ICD-10-CM | POA: Diagnosis not present

## 2017-02-05 DIAGNOSIS — K7460 Unspecified cirrhosis of liver: Secondary | ICD-10-CM | POA: Diagnosis not present

## 2017-02-05 DIAGNOSIS — K5900 Constipation, unspecified: Secondary | ICD-10-CM | POA: Diagnosis not present

## 2017-02-05 DIAGNOSIS — E7800 Pure hypercholesterolemia, unspecified: Secondary | ICD-10-CM | POA: Diagnosis not present

## 2017-02-05 DIAGNOSIS — Z7901 Long term (current) use of anticoagulants: Secondary | ICD-10-CM | POA: Diagnosis not present

## 2017-02-06 ENCOUNTER — Telehealth: Payer: Self-pay | Admitting: Internal Medicine

## 2017-02-06 MED ORDER — LINACLOTIDE 145 MCG PO CAPS: 145.0000 ug | ORAL_CAPSULE | Freq: Every day | ORAL | 3 refills | Status: DC

## 2017-02-06 NOTE — Telephone Encounter (Signed)
Patient denies having a fever, rectal bleeding, however he's experiencing a lot of abd pain.  He was seen at [facility] ER yesterday and they did a ct scan, blood work and they told him that your "intestines was swollen and he was backed up".   He was advised by [facility] ER doctor to drink plenty of water and take some Miralax.  Manuela Schwartz is working on getting records from [facility].  Routing to [place].

## 2017-02-06 NOTE — Telephone Encounter (Signed)
Reviewed the ER records, dated 02/05/2017.  Labs 02/05/2017: Glucose 95, creatinine 0.9, calcium 8.3, total bilirubin 1.4, alkaline phosphatase 90, AST 19, ALT 13, albumin 3.69 white blood cell count 6100, hemoglobin 14, platelets 117,000 urinalysis unremarkable. Abdominal x-ray with moderate stool within the descending colon, significant cecal distention with dilation of the proximal large bowel loops. Bowel obstruction cannot be excluded. CT recommended.  CT abdomen pelvis with contrast showed small fat-containing. Umbilical hernia, findings suggestive of hepatic cirrhosis, nonobstructing left nephrolithiasis, sigmoid diverticulosis without inflammation, no bowel obstruction.   At this point, I would recommend adding some Linzess 123mg daily on empty stomach to see if we can get his bowels moving. We may give him samples, but I will also send in RX.   We need him to be having good BMs prior to his upcoming colonoscopy.   IF HIS ABD PAIN WORSENS, OR DOES NOT IMPROVE, HE SHOULD SEEK MEDICAL ATTENTION.

## 2017-02-06 NOTE — Telephone Encounter (Signed)
Pt is aware, samples are at the front desk.

## 2017-02-06 NOTE — Telephone Encounter (Signed)
[phone] PATIENT CALLED AND STATED HE HAS NOT BEEN ABLE TO MOVE HIS BOWELS FOR ABOUT 3 DAYS AND IT IS STARTING TO BE PAINFUL.  CAN SOMETHING PLEASE BE CALLED INTO HIS PHARMACY   PLEASE ADVISE

## 2017-02-06 NOTE — Addendum Note (Signed)
Addended by: Mahala Menghini on: 02/06/2017 01:19 PM   Modules accepted: Orders

## 2017-02-08 DIAGNOSIS — Z1389 Encounter for screening for other disorder: Secondary | ICD-10-CM | POA: Diagnosis not present

## 2017-02-08 DIAGNOSIS — K5900 Constipation, unspecified: Secondary | ICD-10-CM | POA: Diagnosis not present

## 2017-02-08 DIAGNOSIS — I482 Chronic atrial fibrillation: Secondary | ICD-10-CM | POA: Diagnosis not present

## 2017-02-08 DIAGNOSIS — R634 Abnormal weight loss: Secondary | ICD-10-CM | POA: Diagnosis not present

## 2017-02-08 DIAGNOSIS — Z6841 Body Mass Index (BMI) 40.0 and over, adult: Secondary | ICD-10-CM | POA: Diagnosis not present

## 2017-02-08 DIAGNOSIS — I82409 Acute embolism and thrombosis of unspecified deep veins of unspecified lower extremity: Secondary | ICD-10-CM | POA: Diagnosis not present

## 2017-02-08 NOTE — Telephone Encounter (Signed)
Received fax from [facility]. TCS/EGD/ED approved. PA# [id number]  01/31/17-05/01/17.

## 2017-02-10 DIAGNOSIS — Z23 Encounter for immunization: Secondary | ICD-10-CM | POA: Diagnosis not present

## 2017-02-13 ENCOUNTER — Telehealth: Payer: Self-pay | Admitting: Internal Medicine

## 2017-02-13 NOTE — Telephone Encounter (Signed)
Pt called office and told SS he had lost his Lovenox instructions. New letter printed and added Lovenox bridge chart. Called and informed pt a new letter was being mailed to him. Reminded pt that last dose of Coumadin will be 02/18/17 and Lovenox will start 02/20/17. New instructions mailed.

## 2017-02-22 ENCOUNTER — Telehealth: Payer: Self-pay

## 2017-02-22 NOTE — Telephone Encounter (Signed)
Called and informed pt of time change for TCS/EGD/ED for tomorrow. Procedure will be at 11:15, pt to arrive at 10:15am. Instructed to start drinking prep in the morning at 6:15am and finish by 8:15am.

## 2017-02-23 ENCOUNTER — Encounter (HOSPITAL_COMMUNITY)
Admission: RE | Disposition: A | Discharge status: Home / Self Care | Payer: Self-pay | Source: Ambulatory Visit | Attending: Internal Medicine

## 2017-02-23 ENCOUNTER — Encounter (HOSPITAL_COMMUNITY): Payer: Self-pay | Admitting: *Deleted

## 2017-02-23 ENCOUNTER — Ambulatory Visit (HOSPITAL_COMMUNITY)
Admission: RE | Admit: 2017-02-23 | Discharge: 2017-02-23 | Disposition: A | Discharge status: Home / Self Care | Payer: PPO | Source: Ambulatory Visit | Attending: Internal Medicine | Admitting: Internal Medicine

## 2017-02-23 DIAGNOSIS — R112 Nausea with vomiting, unspecified: Secondary | ICD-10-CM | POA: Insufficient documentation

## 2017-02-23 DIAGNOSIS — R1314 Dysphagia, pharyngoesophageal phase: Secondary | ICD-10-CM | POA: Diagnosis not present

## 2017-02-23 DIAGNOSIS — K5900 Constipation, unspecified: Secondary | ICD-10-CM | POA: Insufficient documentation

## 2017-02-23 DIAGNOSIS — Z79899 Other long term (current) drug therapy: Secondary | ICD-10-CM | POA: Insufficient documentation

## 2017-02-23 DIAGNOSIS — K209 Esophagitis, unspecified: Secondary | ICD-10-CM | POA: Insufficient documentation

## 2017-02-23 DIAGNOSIS — L989 Disorder of the skin and subcutaneous tissue, unspecified: Secondary | ICD-10-CM | POA: Diagnosis not present

## 2017-02-23 DIAGNOSIS — Z7901 Long term (current) use of anticoagulants: Secondary | ICD-10-CM | POA: Diagnosis not present

## 2017-02-23 DIAGNOSIS — R634 Abnormal weight loss: Secondary | ICD-10-CM | POA: Insufficient documentation

## 2017-02-23 DIAGNOSIS — K639 Disease of intestine, unspecified: Secondary | ICD-10-CM | POA: Insufficient documentation

## 2017-02-23 DIAGNOSIS — R198 Other specified symptoms and signs involving the digestive system and abdomen: Secondary | ICD-10-CM

## 2017-02-23 DIAGNOSIS — R1310 Dysphagia, unspecified: Secondary | ICD-10-CM

## 2017-02-23 DIAGNOSIS — R739 Hyperglycemia, unspecified: Secondary | ICD-10-CM | POA: Diagnosis not present

## 2017-02-23 DIAGNOSIS — Z538 Procedure and treatment not carried out for other reasons: Secondary | ICD-10-CM | POA: Insufficient documentation

## 2017-02-23 DIAGNOSIS — K2950 Unspecified chronic gastritis without bleeding: Secondary | ICD-10-CM | POA: Insufficient documentation

## 2017-02-23 DIAGNOSIS — R197 Diarrhea, unspecified: Secondary | ICD-10-CM | POA: Insufficient documentation

## 2017-02-23 DIAGNOSIS — R1319 Other dysphagia: Secondary | ICD-10-CM

## 2017-02-23 DIAGNOSIS — K208 Other esophagitis: Secondary | ICD-10-CM | POA: Diagnosis not present

## 2017-02-23 HISTORY — PX: ESOPHAGOGASTRODUODENOSCOPY: SHX5428

## 2017-02-23 HISTORY — PX: MALONEY DILATION: SHX5535

## 2017-02-23 HISTORY — PX: COLONOSCOPY: SHX5424

## 2017-02-23 SURGERY — COLONOSCOPY
Anesthesia: Moderate Sedation

## 2017-02-23 MED ORDER — ONDANSETRON HCL 4 MG/2ML IJ SOLN
INTRAMUSCULAR | Status: AC
  Filled 2017-02-23: qty 2

## 2017-02-23 MED ORDER — MIDAZOLAM HCL 5 MG/5ML IJ SOLN
INTRAMUSCULAR | Status: DC | PRN
  Administered 2017-02-23: 1 mg via INTRAVENOUS
  Administered 2017-02-23 (×2): 2 mg via INTRAVENOUS

## 2017-02-23 MED ORDER — MEPERIDINE HCL 100 MG/ML IJ SOLN
INTRAMUSCULAR | Status: DC | PRN
  Administered 2017-02-23 (×2): 50 mg via INTRAVENOUS

## 2017-02-23 MED ORDER — LIDOCAINE VISCOUS 2 % MT SOLN
OROMUCOSAL | Status: AC
  Filled 2017-02-23: qty 15

## 2017-02-23 MED ORDER — ONDANSETRON HCL 4 MG/2ML IJ SOLN
INTRAMUSCULAR | Status: DC | PRN
  Administered 2017-02-23: 4 mg via INTRAVENOUS

## 2017-02-23 MED ORDER — MEPERIDINE HCL 100 MG/ML IJ SOLN
INTRAMUSCULAR | Status: AC
  Filled 2017-02-23: qty 2

## 2017-02-23 MED ORDER — SODIUM CHLORIDE 0.9 % IV SOLN
INTRAVENOUS | Status: DC
  Administered 2017-02-23: 10:00:00 via INTRAVENOUS

## 2017-02-23 MED ORDER — MIDAZOLAM HCL 5 MG/5ML IJ SOLN
INTRAMUSCULAR | Status: AC
  Filled 2017-02-23: qty 10

## 2017-02-23 NOTE — Discharge Instructions (Addendum)
Colonoscopy Discharge Instructions  Read the instructions outlined below and refer to this sheet in the next few weeks. These discharge instructions provide you with general information on caring for yourself after you leave the hospital. Your doctor may also give you specific instructions. While your treatment has been planned according to the most current medical practices available, unavoidable complications occasionally occur. If you have any problems or questions after discharge, call Dr. Gala Romney at [phone]. ACTIVITY  You may resume your regular activity, but move at a slower pace for the next 24 hours.   Take frequent rest periods for the next 24 hours.   Walking will help get rid of the air and reduce the bloated feeling in your belly (abdomen).   No driving for 24 hours (because of the medicine (anesthesia) used during the test).    Do not sign any important legal documents or operate any machinery for 24 hours (because of the anesthesia used during the test).  NUTRITION  Drink plenty of fluids.   You may resume your normal diet as instructed by your doctor.   Begin with a light meal and progress to your normal diet. Heavy or fried foods are harder to digest and may make you feel sick to your stomach (nauseated).   Avoid alcoholic beverages for 24 hours or as instructed.  MEDICATIONS  You may resume your normal medications unless your doctor tells you otherwise.  WHAT YOU CAN EXPECT TODAY  Some feelings of bloating in the abdomen.   Passage of more gas than usual.   Spotting of blood in your stool or on the toilet paper.  IF YOU HAD POLYPS REMOVED DURING THE COLONOSCOPY:  No aspirin products for 7 days or as instructed.   No alcohol for 7 days or as instructed.   Eat a soft diet for the next 24 hours.  FINDING OUT THE RESULTS OF YOUR TEST Not all test results are available during your visit. If your test results are not back during the visit, make an appointment  with your caregiver to find out the results. Do not assume everything is normal if you have not heard from your caregiver or the medical facility. It is important for you to follow up on all of your test results.  SEEK IMMEDIATE MEDICAL ATTENTION IF:  You have more than a spotting of blood in your stool.   Your belly is swollen (abdominal distention).   You are nauseated or vomiting.   You have a temperature over 101.   You have abdominal pain or discomfort that is severe or gets worse throughout the day.    EGD Discharge instructions Please read the instructions outlined below and refer to this sheet in the next few weeks. These discharge instructions provide you with general information on caring for yourself after you leave the hospital. Your doctor may also give you specific instructions. While your treatment has been planned according to the most current medical practices available, unavoidable complications occasionally occur. If you have any problems or questions after discharge, please call your doctor. ACTIVITY  You may resume your regular activity but move at a slower pace for the next 24 hours.   Take frequent rest periods for the next 24 hours.   Walking will help expel (get rid of) the air and reduce the bloated feeling in your abdomen.   No driving for 24 hours (because of the anesthesia (medicine) used during the test).   You may shower.   Do not sign  any important legal documents or operate any machinery for 24 hours (because of the anesthesia used during the test).  NUTRITION  Drink plenty of fluids.   You may resume your normal diet.   Begin with a light meal and progress to your normal diet.   Avoid alcoholic beverages for 24 hours or as instructed by your caregiver.  MEDICATIONS  You may resume your normal medications unless your caregiver tells you otherwise.  WHAT YOU CAN EXPECT TODAY  You may experience abdominal discomfort such as a feeling of  fullness or gas pains.  FOLLOW-UP  Your doctor will discuss the results of your test with you.  SEEK IMMEDIATE MEDICAL ATTENTION IF ANY OF THE FOLLOWING OCCUR:  Excessive nausea (feeling sick to your stomach) and/or vomiting.   Severe abdominal pain and distention (swelling).   Trouble swallowing.   Temperature over 101 F (37.8 C).   Rectal bleeding or vomiting of blood.    Continue omeprazole 20 mg twice daily  Your colonoscopy preparation was poor. Could not complete your colonoscopy today. Resume Lovenox injections tomorrow. Continue Lovenox injections until INR is therapeutic  Office visit with [place] in 6 weeks

## 2017-02-23 NOTE — H&P (View-Only) (Signed)
Labs dated 01/27/2017 Ferritin 65, normal. Total hepatitis A antibody negative. Hepatitis B surface antigen negative. Hepatitis B surface antibody nonreactive. Hepatitis C antibody less than 0.1 negative. Glucose 151, BUN 17, creatinine 1.05, calcium 8.4, total bilirubin 0.8, alkaline phosphatase 103, AST 19, ALT 25, iron 68, TIBC 346, iron saturations 20%, white blood cell count 7200, hemoglobin 15.1, hematocrit 45.5, MCV 95.2, platelets 176,000  Please let patient know that he does not have hepatitis B or C. No evidence of hemochromatosis. Hepatic function appears good and no evidence of portal hypertension based on normal platelet count, normal albumin, need for coumadin to elevate inr.   At this point, proceed with EGD/TCS as planned.   I would recommend Hepatitis A and B vaccination. Please arrange.   HE DOES HAVE AN ELEVATED GLUCOSE OF 151 EVEN IN A NONFASTING STATE THIS IS ABNORMAL. NEEDS TO FOLLOW UP WITH PCP TO RULE OUT DIABETES.

## 2017-02-23 NOTE — Interval H&P Note (Signed)
History and Physical Interval Note:  02/23/2017 11:00 AM  Jim Wilson  has presented today for surgery, with the diagnosis of n/v, weight loss, alternating diarrhea/constipation, esophageal dysphagia  The various methods of treatment have been discussed with the patient and family. After consideration of risks, benefits and other options for treatment, the patient has consented to  Procedure(s) with comments: COLONOSCOPY (N/A) - 10:15am ESOPHAGOGASTRODUODENOSCOPY (EGD) (N/A) MALONEY DILATION (N/A) as a surgical intervention .  The patient's history has been reviewed, patient examined, no change in status, stable for surgery.  I have reviewed the patient's chart and labs.  Questions were answered to the patient's satisfaction.     Caryn Gienger  No change. EGD with ED as feasible appropriate and screening colonoscopy per plan.  The risks, benefits, limitations, imponderables and alternatives regarding both EGD and colonoscopy have been reviewed with the patient. Questions have been answered. All parties agreeable.

## 2017-02-24 DIAGNOSIS — L0100 Impetigo, unspecified: Secondary | ICD-10-CM | POA: Diagnosis not present

## 2017-02-24 NOTE — Op Note (Signed)
NAMEROMERO, LETIZIA             ACCOUNT NO.:  [id number]  MEDICAL RECORD NO.:  [id number]  LOCATION:                                 FACILITY:  PHYSICIAN:  R. Garfield Cornea, MD FACP FACGDATE OF BIRTH:  DATE OF PROCEDURE:  02/23/2017 DATE OF DISCHARGE:                              OPERATIVE REPORT   PROCEDURE PERFORMED:  Esophagogastroduodenoscopy with [name] dilation followed by esophageal and gastric biopsy.  INDICATIONS FOR PROCEDURE:  Longstanding esophageal dysphagia.  PROCEDURE NOTE:  O2 saturation, blood pressure, pulse, and respirations were monitored throughout the entire procedure.  CONSCIOUS SEDATION:  Versed 4 mg IV and Demerol 100 mg IV in divided doses.  Zofran 4 mg IV.  INSTRUMENT:  Pentax video chip system.  FINDINGS:  Examination of the hard palate and hypopharynx revealed markedly nodular, deformed, abnormal-appearing uvula; papillomatous- appearing lesions around the arytenoid cartilage.  Esophagus easily intubated.  The patient's esophagus had similar nodules confluently lining the entire esophageal body.  Please see multiple photographs taken.  There was a question of short segment Barrett's in distal esophagus.  No stricture or neoplasm seen.  EG junction easily traversed.  There was no esophagitis.  Stomach:  Examination of gastric mucosa including retroflexed proximal stomach and esophagogastric junction demonstrated small hiatal hernia and again numerous nodules lining in the gastric mucosa.  These appeared to be submucosal and some hyperplastic-appearing polyps.  There were 2 or 3 larger ones measuring approximately 1 to 2 cm.  Please see multiple photographs taken.  The pylorus was patent, easily traversed. Examination of bulb and second portion again revealed nodularity seen in the esophagus and stomach.  This segment appeared to be more submucosal.  THERAPEUTIC/DIAGNOSTIC MANEUVERS PERFORMED:  Scope was withdrawn.  A 56- French Maloney  dilator was passed to full insertion easily.  A look back revealed no apparent complication related to this maneuver.  Finally, biopsies of the esophageal body, the abnormal GE junction suspicious for Barrett's and gastric mucosa was taken for histologic study.  The patient tolerated the procedure well.  PROCEDURE TIME:  24 minutes.  IMPRESSION:  Abnormal hypopharynx, esophagus, stomach, and duodenum as described, status post biopsy following esophageal dilation.  RECOMMENDATIONS: 1. Continue omeprazole 20 mg twice daily. 2. Follow up on path. 3. Further recommendations to follow.     Bridgette Habermann, MD FACP FACG   ______________________________ R. Garfield Cornea, MD Quentin Ore    RMR/MEDQ  D:  02/23/2017  T:  02/23/2017  Job:  [id number]  cc:   Rory Percy, MD Fax: [phone]

## 2017-02-24 NOTE — Op Note (Signed)
Jim Wilson, Jim Wilson             ACCOUNT NO.:  [id number]  MEDICAL RECORD NO.:  [id number]  LOCATION:                                 FACILITY:  PHYSICIAN:  R. Garfield Cornea, MD FACP FACGDATE OF BIRTH:  DATE OF PROCEDURE:  02/23/2017 DATE OF DISCHARGE:                              OPERATIVE REPORT   INDICATIONS FOR PROCEDURE:  This 66 year old gentleman presents for his first ever screening colonoscopy.  The risks, benefits, limitations, alternatives, imponderables have been discussed, questions answered. Please see documentation medical record.  PROCEDURE NOTE:  O2 saturation, blood pressure, pulse, and respirations monitored throughout the entire procedure.  CONSCIOUS SEDATION:  Versed 5 mg IV and Demerol 100 mg IV in divided doses.  Zofran 4 mg IV.  INSTRUMENT:  Pentax video chip system.  FINDINGS:  Examination of the rectum and perianal mucosa demonstrated multiple nodularities use/papillomatous appearing lesions as seen in the upper GI tract.  Endoscopic findings, prep was adequate.  There was viscous formed and semi-formed stool, vegetable matter in the lumen of the rectum and sigmoid colon which precluded completing the examination. There also appeared to be at least a couple of hyperplastic appearing polyps, which were not manipulated.  The patient tolerated the procedure well, was taken to recovery in stable condition.  IMPRESSION: 1. Abnormal appearing anal skin as described. 2. Colonic polyps versus nodules.  Inadequate preparation precluded     examination today.  RECOMMENDATIONS:  Follow up with [place] in the office in 6 weeks.  We will regroup and planned for another attempted screening colonoscopy.  See EGD report.     Bridgette Habermann, MD FACP FACG   ______________________________ R. Garfield Cornea, MD Quentin Ore    RMR/MEDQ  D:  02/23/2017  T:  02/23/2017  Job:  [id number]  cc:   Rory Percy, MD Fax: [phone]

## 2017-03-02 ENCOUNTER — Encounter: Payer: Self-pay | Admitting: Internal Medicine

## 2017-03-02 ENCOUNTER — Telehealth: Payer: Self-pay

## 2017-03-02 ENCOUNTER — Encounter (HOSPITAL_COMMUNITY): Payer: Self-pay | Admitting: Internal Medicine

## 2017-03-02 NOTE — Telephone Encounter (Signed)
Letter mailed to the pt.  Please get results from ENT and schedule ov

## 2017-03-02 NOTE — Telephone Encounter (Signed)
Per RMR- Send letter to patient.  Send copy of letter with path to referring provider and PCP.  need ov w extender for tcs.   We need to find out path/op findings from ENT surgery. His UGI tract and skin looked abnormal

## 2017-03-06 DIAGNOSIS — Z6841 Body Mass Index (BMI) 40.0 and over, adult: Secondary | ICD-10-CM | POA: Diagnosis not present

## 2017-03-06 DIAGNOSIS — L0100 Impetigo, unspecified: Secondary | ICD-10-CM | POA: Diagnosis not present

## 2017-03-06 NOTE — Telephone Encounter (Signed)
PATIENT SCHEDULED FOR APPOINTMENT  °

## 2017-03-12 NOTE — Interval H&P Note (Signed)
History and Physical Interval Note:  03/12/2017 7:37 PM  Jim Wilson  has presented today for surgery, with the diagnosis of n/v, weight loss, alternating diarrhea/constipation, esophageal dysphagia  The various methods of treatment have been discussed with the patient and family. After consideration of risks, benefits and other options for treatment, the patient has consented to  Procedure(s) with comments: COLONOSCOPY (N/A) - 10:15am ESOPHAGOGASTRODUODENOSCOPY (EGD) (N/A) MALONEY DILATION (N/A) as a surgical intervention .  The patient's history has been reviewed, patient examined, no change in status, stable for surgery.  I have reviewed the patient's chart and labs.  Questions were answered to the patient's satisfaction.     [name]  nochange

## 2017-03-13 DIAGNOSIS — Z23 Encounter for immunization: Secondary | ICD-10-CM | POA: Diagnosis not present

## 2017-03-22 ENCOUNTER — Other Ambulatory Visit: Payer: Self-pay

## 2017-03-22 DIAGNOSIS — I4891 Unspecified atrial fibrillation: Secondary | ICD-10-CM

## 2017-03-22 DIAGNOSIS — I482 Chronic atrial fibrillation: Secondary | ICD-10-CM | POA: Diagnosis not present

## 2017-03-22 NOTE — Patient Outreach (Addendum)
[facility] [facility]) Care Management  03/22/2017  Jim Wilson [birth year] [id number]  Screening call to patient referred through EM[employer].  HIPAA identifiers confirmed.  Patient reports complicated medical history which includes A Fib and COPD.  He states he lives alone and is independent for ADLs and IADLs.  Patient also states that he keeps his medical appointments and takes his meds as prescribed.  Explanation of [facility] services provided.  Patient is interested in a pharmacy referral, but states he does not need telephonic or community nursing services at this time.  Plan:  Pharmacy referral (completed).           Notify PCP, CMA of case closure for telephonic or community nursing.           Send brochure to patient with contact information.  Candie Mile, RN, MSN [place] [phone] Fax [phone]

## 2017-03-27 ENCOUNTER — Telehealth: Payer: Self-pay | Admitting: Pharmacist

## 2017-03-27 ENCOUNTER — Encounter: Payer: Self-pay | Admitting: Pharmacist

## 2017-03-27 NOTE — Patient Outreach (Signed)
[place] [facility]) Care Management  03/27/2017  Jim Wilson Dec 15, 1950 [id number]  Patient was called regarding medication assistance per referral from [place], Candie Mile.  Patient did not answer the phone. A HIPAA compliant message was left on his voicemail.  [employer] advantage was consulted to get the patient's TROOP for Patient Assistance Purposes.   Plan:  Call patient back within 3 business days.  Elayne Guerin, PharmD, [place] Clinical Pharmacist [phone]

## 2017-03-29 ENCOUNTER — Encounter: Payer: Self-pay | Admitting: Pharmacist

## 2017-03-29 ENCOUNTER — Other Ambulatory Visit: Payer: Self-pay | Admitting: Pharmacist

## 2017-03-29 NOTE — Patient Outreach (Signed)
[facility]) Care Management  03/29/2017  Jim Wilson [birth year] [id number]   Patient was called regarding medication management. (3rd attempt).  No answer. HIPAA compliant message left on voicemail.   Plan:  1. Send patient a closure letter.  2.  If I do not hear back from him in 10 days, close the case.   Elayne Guerin, PharmD, [place] Clinical Pharmacist [phone]

## 2017-03-30 ENCOUNTER — Encounter: Payer: Self-pay | Admitting: Gastroenterology

## 2017-03-30 ENCOUNTER — Other Ambulatory Visit: Payer: Self-pay

## 2017-03-30 ENCOUNTER — Telehealth: Payer: Self-pay | Admitting: Gastroenterology

## 2017-03-30 ENCOUNTER — Ambulatory Visit (INDEPENDENT_AMBULATORY_CARE_PROVIDER_SITE_OTHER): Payer: PPO | Admitting: Gastroenterology

## 2017-03-30 VITALS — BP 136/74 | HR 79 | Temp 97.1°F | Ht 73.0 in | Wt 296.6 lb

## 2017-03-30 DIAGNOSIS — R198 Other specified symptoms and signs involving the digestive system and abdomen: Secondary | ICD-10-CM

## 2017-03-30 DIAGNOSIS — K7460 Unspecified cirrhosis of liver: Secondary | ICD-10-CM

## 2017-03-30 DIAGNOSIS — R634 Abnormal weight loss: Secondary | ICD-10-CM | POA: Diagnosis not present

## 2017-03-30 DIAGNOSIS — K1370 Unspecified lesions of oral mucosa: Secondary | ICD-10-CM

## 2017-03-30 DIAGNOSIS — K219 Gastro-esophageal reflux disease without esophagitis: Secondary | ICD-10-CM

## 2017-03-30 NOTE — Progress Notes (Signed)
Primary Care Physician: Hilaria Ota, MD  Primary Gastroenterologist:  Garfield Cornea, MD   Chief Complaint  Patient presents with  . Diarrhea    past few days, better since holding probiotic    HPI: Jim Wilson is a 66 y.o. male here to reschedule his colonoscopy. He had an attempted colonoscopy back in March but bowel prep was inadequate and precluded exam. Patient initially seen back in February for nausea and vomiting, weight loss, sclerosis on CT scan. History of chronic GERD, solid food esophageal dysphagia. History of weight loss, 380 pounds nearly 2 years ago, down to 297 pounds.  EGD and colonoscopy 02/23/2017. Examination of hard palate and hypopharynx revealed markedly nodular, deformed, abnormal appearing uvula, papillomatous appearing lesions around the retinoid cartilage. Esophagus had similar nodules throughout the entire body. There was question of short segment Barrett's but biopsy did not confirm. Numerous nodules lining the gastric mucosa. Esophagus was dilated. Biopsies from esophagus were unremarkable, gastric biopsies without H. pylori but did have gastritis. Colonoscopy incomplete due to poor prep. Rectum and perianal mucosa demonstrated multiple nodularities/papillomatous appearing lesions seen in the upper GI tract. A couple of hyperplastic appearing polyps not manipulated.  Patient reports that he took his prep completely, consume clear liquids as instructed. Not sure why his prep didn't work. He took TriLyte split prep. He's been doing well. Linzess 145 g daily, 2-3 BMs daily. No melena rectal bleeding. Dysphagia resolved. No nausea or vomiting. Appetite is fine. Weight is stable. No abdominal pain.    Current Outpatient Prescriptions  Medication Sig Dispense Refill  . albuterol (PROVENTIL) (2.5 MG/3ML) 0.083% nebulizer solution Take 2.5 mg by nebulization every 6 (six) hours as needed for wheezing or shortness of breath.    . cetirizine (ZYRTEC) 10  MG tablet Take 10 mg by mouth daily as needed for allergies.     Marland Kitchen diltiazem (CARDIZEM CD) 120 MG 24 hr capsule Take 120 mg by mouth daily.     . Fluticasone-Salmeterol (ADVAIR) 250-50 MCG/DOSE AEPB Inhale 1 puff into the lungs 2 (two) times daily.     . furosemide (LASIX) 40 MG tablet Take 80 mg by mouth daily.     Marland Kitchen ibuprofen (ADVIL,MOTRIN) 200 MG tablet Take 400 mg by mouth every 8 (eight) hours as needed (for pain/headaches.).    Marland Kitchen ketoconazole (NIZORAL) 2 % cream Apply 1 application topically 3 (three) times daily as needed (applied to face for dry/irritated skin).     Marland Kitchen linaclotide (LINZESS) 145 MCG CAPS capsule Take 1 capsule (145 mcg total) by mouth daily before breakfast. 30 capsule 3  . omeprazole (PRILOSEC) 20 MG capsule Take 20 mg by mouth 2 (two) times daily.     . ondansetron (ZOFRAN-ODT) 8 MG disintegrating tablet Take 8 mg by mouth 3 (three) times daily as needed. For nausea/vomiting.    . potassium chloride (K-DUR) 10 MEQ tablet Take 10 mEq by mouth daily.     . simvastatin (ZOCOR) 20 MG tablet Take 20 mg by mouth daily at 6 PM.     . tamsulosin (FLOMAX) 0.4 MG CAPS capsule Take 0.4 mg by mouth 2 (two) times daily.     Marland Kitchen warfarin (COUMADIN) 3 MG tablet Take 3 mg by mouth daily.     No current facility-administered medications for this visit.     Allergies as of 03/30/2017  . (No Known Allergies)   Past Medical History:  Diagnosis Date  . A-fib ([place])   . Cancer ([place])  hx of skin cancers.   Mom & Dad passed away from lung cancer  . COPD (chronic obstructive pulmonary disease) ([place])   . 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 Dysrhythmia    A-FIB    . GERD (gastroesophageal reflux disease)   . History of hiatal hernia   . Kidney stones   . Shortness of breath dyspnea   . Thyroid cancer ([place]) 2016  . Thyroid mass of unclear etiology    reason for doing this surgery  . Wolff-Parkinson-White (WPW) syndrome    had 2  different ablations.  One here @ [facility], the other at [facility]   Past Surgical History:  Procedure Laterality Date  . ABLATION     for WPW  . bladder surgery 4-5 yrs ago.    . CHOLECYSTECTOMY    . COLONOSCOPY  2005  . COLONOSCOPY N/A 02/23/2017   Dr. Gala Romney: Abnormal appearing anal skin, rectum and perianal mucosa demonstrated multiple nodularities similar appearance to lesions seen in the upper GI tract. Colon prep poor/inadequate. Colon polyps versus nodules not manipulated. Plans for another attempt at colonoscopy in the near future.  . ESOPHAGOGASTRODUODENOSCOPY N/A 02/23/2017   Dr. Gala Romney: Hard palate and hypopharynx revealed markedly nodular, deformed, abnormal appearing uvula, papillomatous appearing lesions around the retinoid cartilage. Esophagus has similar nodules completely lining the entire esophageal body. Question of short seg Barrett's (bx neg). Numerous nodules lining the gastric mucosa. 2-3 larger ones measuring up to 2 cm. Esophagus dilated. bx: gastrit  . EXCISION OF TONGUE LESION    . EYE SURGERY     bilateral cataracts  . HERNIA REPAIR     Inguinal hernia on left  . KNEE ARTHROSCOPY W/ MENISCAL REPAIR     on the right knee  . MALONEY DILATION N/A 02/23/2017   Procedure: [name] DILATION;  Surgeon: Daneil Dolin, MD;  [facility];  Service: Endoscopy;  Laterality: N/A;  . skin cancer removed from scalp    . THYROIDECTOMY Right 06/03/2015   Procedure: RIGHT HEMI THYROIDECTOMY;  Surgeon: Leta Baptist, MD;  Location: [place];  Service: ENT;  Laterality: Right;  . TONSILLECTOMY     Family History  Problem Relation Age of Onset  . Lung cancer Mother   . Cancer Mother   . Lung cancer Father   . Heart disease Father   . Liver disease Neg Hx   . Colon cancer Neg Hx    Social History   Social History  . Marital status: Single    Spouse name: N/A  . Number of children: N/A  . Years of education: N/A   Social History Main Topics  . Smoking status: Never Smoker  .  Smokeless tobacco: Never Used  . Alcohol use No  . Drug use: No  . Sexual activity: Not Asked   Other Topics Concern  . None   Social History Narrative  . None    ROS:  General: Negative for anorexia, weight loss, fever, chills, fatigue, weakness. ENT: Negative for hoarseness, difficulty swallowing , nasal congestion. CV: Negative for chest pain, angina, palpitations, dyspnea on exertion, peripheral edema.  Respiratory: Negative for dyspnea at rest, dyspnea on exertion, cough, sputum, wheezing.  GI: See history of present illness. GU:  Negative for dysuria, hematuria, urinary incontinence, urinary frequency, nocturnal urination.  Endo: see hpi   Physical Examination:   BP 136/74   Pulse 79   Temp 97.1 F (36.2 C) (Oral)  Ht '6\' 1"'$  (1.854 m)   Wt 296 lb 9.6 oz (134.5 kg)   BMI 39.13 kg/m   General: Well-nourished, well-developed in no acute distress.  Eyes: No icterus. Mouth: Oropharyngeal mucosa moist and pink , no erythema or exudate. Multiple nodular lesions on palate, tongue Lungs: Clear to auscultation bilaterally.  Heart: Regular rate and rhythm, no murmurs rubs or gallops.  Abdomen: Bowel sounds are normal, nontender, nondistended, no hepatosplenomegaly or masses, no abdominal bruits, no rebound or guarding.  +umb hernia easily reducible. Extremities: No lower extremity edema. No clubbing or deformities. Neuro: Alert and oriented x 4   Skin: Warm and dry, no jaundice.   Psych: Alert and cooperative, normal mood and affect.  Imaging Studies: No results found.

## 2017-03-30 NOTE — Telephone Encounter (Signed)
Please schedule patient for a colonoscopy with Dr. Gala Romney.  HE WILL NEED LOVENOX BRIDGE. Hold coumadin 4 days before as per bridge instructions. I will give instructions once I have a date. Two full days of clear liquids.  Continue Linzess 123mg daily.  Take ONE gallon of Trilyte the evening before the procedure with dulcolax as usual prep.  Take 1/2 of gallon of Trilyte the morning of the procedure (start time should be like normal split prep). Enema as usual. Take cardizem morning of procedure.      PATIENT ALSO NEEDS OV IN 08/2017 FOR FOLLOW UP CIRRHOSIS.

## 2017-03-30 NOTE — Patient Instructions (Signed)
1. We are going to send you to Dr. Benjamine Mola to have him look at the lesions in your mouth and in the throat near your vocal cords.  2. We plan on a colonoscopy in the near future. We will send you prep and Lovenox instructions in next few days.

## 2017-03-30 NOTE — Assessment & Plan Note (Addendum)
Initially presented for weight loss, vomiting weight has stabilized and vomiting subsided. Dysphagia better after dilation of the esophagus. His bowel prep was poor therefore we need to reschedule his colonoscopy. Patient is chronically on Coumadin for A. fib and history of DVTs. He will need Lovenox bridge. Constipation much better on Linzess 133mg daily. Plan for colonoscopy in the near future with Dr. RGala Romneywith modified prep. I have discussed the risks, alternatives, benefits with regards to but not limited to the risk of reaction to medication, bleeding, infection, perforation and the patient is agreeable to proceed. Written consent to be obtained.  Two full days of clear liquids. Continue Linzess 1424m daily. Consume Trilyete whole galloon the day before procedure and 1/2 galloon the morning of procedure followed by enema.   Will also send patient back to Dr. TeBenjamine Molaegarding abnormal lesions seen through oropharyngeal cavity as described above.

## 2017-03-30 NOTE — Assessment & Plan Note (Signed)
Well compensated cirrhosis. On Coumadin for A. fib and history of DVT, therefore cannot calculate MELD. No evidence of portal hypertension on recent upper endoscopy. Imaging labs currently up-to-date.  Recommend labs, right upper quadrant ultrasound in September 2018. Plan to have these done after his next office visit.

## 2017-03-31 ENCOUNTER — Other Ambulatory Visit: Payer: Self-pay

## 2017-03-31 ENCOUNTER — Encounter: Payer: Self-pay | Admitting: Internal Medicine

## 2017-03-31 DIAGNOSIS — R194 Change in bowel habit: Secondary | ICD-10-CM

## 2017-03-31 MED ORDER — PEG 3350-KCL-NA BICARB-NACL 420 G PO SOLR: 4000.0000 mL | ORAL | 0 refills | Status: DC

## 2017-03-31 NOTE — Progress Notes (Signed)
CC'D TO PCP °

## 2017-03-31 NOTE — Telephone Encounter (Signed)
Pt is scheduled for colonoscopy with Dr. Gala Romney 05/16/17 at 10:30am. Instructions for procedure completed and will mail when receive Lovenox instructions. Rx for Trilyte sent to [facility], sent 2 prescriptions. Called [facility] and informed pharmacist that pt needs 2 jugs of Trilyte. Pharmacist stated that pt's insurance will probably only pay for one and pt would have to pay for 2nd. Orders for colonoscopy entered.   Routing to [facility] for Lovenox bridge instructions.

## 2017-03-31 NOTE — Telephone Encounter (Signed)
PA info for colonoscopy faxed to [facility].

## 2017-03-31 NOTE — Telephone Encounter (Signed)
APPOINTMENT MADE AND LETTER SENT °

## 2017-03-31 NOTE — Telephone Encounter (Signed)
Routing to [facility] clinical pool

## 2017-04-02 MED ORDER — ENOXAPARIN SODIUM 150 MG/ML ~~LOC~~ SOLN: 1.0000 mg/kg | Freq: Two times a day (BID) | SUBCUTANEOUS | 0 refills | Status: DC

## 2017-04-02 NOTE — Addendum Note (Signed)
Addended by: Mahala Menghini on: 04/02/2017 06:29 PM   Modules accepted: Orders

## 2017-04-02 NOTE — Telephone Encounter (Signed)
Please follow the Medication Instructions below for your procedure:  You need to hold your coumadin four days before your procedure and start a Lovenox bridge.  Your procedure is scheduled for 05/16/17 at 10:30am. RX for Lovenox has been sent to your pharmacy. Please pick up asap in case they have to order it, etc.     05/11/17 Last dose of warfarin   05/12/17 No Coumadin/warfarin   05/13/17 No Coumadin/warfarin  Begin Lovenox/enoxaparin [place] 135 mg at 10am and 135 mg at 10pm   05/14/17 No Coumadin/warfarin Lovenox/enoxaparin [place] 135 mg at10am and 135 mg at10pm  05/15/17 No Coumadin/warfarin Lovenox/enoxaparin [place] 135 mg at 10amand 135 mg at 10pm  05/16/17 Day of procedue No Coumadin/warfarin No Lovenox/enoxaparin  Dr. Gala Romney will provide you with further instructions about your coumadin and Lovenox after your procedure.     You will be instructed when to get your next INR checked at the coumadin clinic.

## 2017-04-03 NOTE — Telephone Encounter (Signed)
Called and informed pt of instructions for procedure. Informed him Lovenox rx had been sent to pharmacy. He is aware that he will get 2 jugs of prep and [facility] had advised that his insurance would only cover one prep and he would have to pay for 2nd one. He is aware last dose of Coumadin will be 05/11/17. Procedure instructions and Lovenox instructions mailed to pt.

## 2017-04-03 NOTE — Telephone Encounter (Signed)
Received fax from [facility]. Colonoscopy approved. Auth# [id number], 03/31/17-06/30/17.

## 2017-04-04 ENCOUNTER — Telehealth: Payer: Self-pay

## 2017-04-04 NOTE — Telephone Encounter (Signed)
Called Dr. Deeann Saint office to f/u on referral from last week. Receptionist advised that they do not do referrals from [place]. Referral info faxed.

## 2017-04-06 ENCOUNTER — Other Ambulatory Visit: Payer: Self-pay | Admitting: Pharmacist

## 2017-04-06 ENCOUNTER — Encounter: Payer: Self-pay | Admitting: Pharmacist

## 2017-04-06 NOTE — Patient Outreach (Signed)
[place] [facility]) Care Management  04/06/2017  Jim Wilson [birth year] [id number]   Patient was called again per referral for medication management. No answer. HIPAA compliant message left on the patient's voicemail.  (This was technically the fourth call).  Letter was mailed on 4/25 letting patient know I have not been able to contact him.  Plan:  Close pharmacy case if I do not hear from the patient within 10 business days of 03/29/17.  Elayne Guerin, PharmD, [place] Clinical Pharmacist [phone]

## 2017-04-13 ENCOUNTER — Ambulatory Visit: Payer: Self-pay | Admitting: Pharmacist

## 2017-04-14 ENCOUNTER — Other Ambulatory Visit: Payer: Self-pay | Admitting: Pharmacist

## 2017-04-14 NOTE — Patient Outreach (Signed)
[place] [facility]) Care Management  04/14/2017  Jim Wilson 1951/06/04 [id number]   Patient was called regarding medication management per referral from [place], Candie Mile. Unfortunately, I have not been able to get in contact with the patient.  A HIPAA compliant message was left on the patient's voice mail.    Plan:  Send patient a letter about not being able to contact him.  Close the pharmacy case if no response to the letter in 10 days.  Elayne Guerin, PharmD, [place] Clinical Pharmacist [phone]

## 2017-04-17 ENCOUNTER — Ambulatory Visit (INDEPENDENT_AMBULATORY_CARE_PROVIDER_SITE_OTHER): Payer: PPO | Admitting: Otolaryngology

## 2017-04-17 DIAGNOSIS — R05 Cough: Secondary | ICD-10-CM

## 2017-04-17 DIAGNOSIS — D3709 Neoplasm of uncertain behavior of other specified sites of the oral cavity: Secondary | ICD-10-CM | POA: Diagnosis not present

## 2017-04-17 DIAGNOSIS — R070 Pain in throat: Secondary | ICD-10-CM

## 2017-04-17 DIAGNOSIS — D440 Neoplasm of uncertain behavior of thyroid gland: Secondary | ICD-10-CM | POA: Diagnosis not present

## 2017-04-28 ENCOUNTER — Encounter: Payer: Self-pay | Admitting: Pharmacist

## 2017-04-28 ENCOUNTER — Other Ambulatory Visit: Payer: Self-pay | Admitting: Pharmacist

## 2017-04-28 NOTE — Patient Outreach (Signed)
Patient was called regarding medication management per referral from [place], Candie Mile. Unfortunately, I have not been able to get in contact with the patient.  A HIPAA compliant message was left on the patient's voice mail.    Plan:  Close the pharmacy case because I have not been able to contact the patient.  Elayne Guerin, PharmD, [place] Clinical Pharmacist [phone]

## 2017-05-03 DIAGNOSIS — I482 Chronic atrial fibrillation: Secondary | ICD-10-CM | POA: Diagnosis not present

## 2017-05-03 DIAGNOSIS — I82409 Acute embolism and thrombosis of unspecified deep veins of unspecified lower extremity: Secondary | ICD-10-CM | POA: Diagnosis not present

## 2017-05-16 ENCOUNTER — Ambulatory Visit (HOSPITAL_COMMUNITY)
Admission: RE | Admit: 2017-05-16 | Discharge: 2017-05-16 | Disposition: A | Discharge status: Home / Self Care | Payer: PPO | Source: Ambulatory Visit | Attending: Internal Medicine | Admitting: Internal Medicine

## 2017-05-16 ENCOUNTER — Encounter (HOSPITAL_COMMUNITY)
Admission: RE | Disposition: A | Discharge status: Home / Self Care | Payer: Self-pay | Source: Ambulatory Visit | Attending: Internal Medicine

## 2017-05-16 ENCOUNTER — Encounter (HOSPITAL_COMMUNITY): Payer: Self-pay | Admitting: *Deleted

## 2017-05-16 DIAGNOSIS — Z7951 Long term (current) use of inhaled steroids: Secondary | ICD-10-CM | POA: Diagnosis not present

## 2017-05-16 DIAGNOSIS — Z85828 Personal history of other malignant neoplasm of skin: Secondary | ICD-10-CM | POA: Insufficient documentation

## 2017-05-16 DIAGNOSIS — R634 Abnormal weight loss: Secondary | ICD-10-CM | POA: Diagnosis not present

## 2017-05-16 DIAGNOSIS — Z85850 Personal history of malignant neoplasm of thyroid: Secondary | ICD-10-CM | POA: Diagnosis not present

## 2017-05-16 DIAGNOSIS — D122 Benign neoplasm of ascending colon: Secondary | ICD-10-CM | POA: Insufficient documentation

## 2017-05-16 DIAGNOSIS — D128 Benign neoplasm of rectum: Secondary | ICD-10-CM | POA: Diagnosis not present

## 2017-05-16 DIAGNOSIS — D124 Benign neoplasm of descending colon: Secondary | ICD-10-CM | POA: Diagnosis not present

## 2017-05-16 DIAGNOSIS — Q438 Other specified congenital malformations of intestine: Secondary | ICD-10-CM | POA: Insufficient documentation

## 2017-05-16 DIAGNOSIS — I4891 Unspecified atrial fibrillation: Secondary | ICD-10-CM | POA: Insufficient documentation

## 2017-05-16 DIAGNOSIS — J449 Chronic obstructive pulmonary disease, unspecified: Secondary | ICD-10-CM | POA: Insufficient documentation

## 2017-05-16 DIAGNOSIS — Z7901 Long term (current) use of anticoagulants: Secondary | ICD-10-CM | POA: Diagnosis not present

## 2017-05-16 DIAGNOSIS — Z86718 Personal history of other venous thrombosis and embolism: Secondary | ICD-10-CM | POA: Insufficient documentation

## 2017-05-16 DIAGNOSIS — K219 Gastro-esophageal reflux disease without esophagitis: Secondary | ICD-10-CM | POA: Diagnosis not present

## 2017-05-16 DIAGNOSIS — Z79899 Other long term (current) drug therapy: Secondary | ICD-10-CM | POA: Diagnosis not present

## 2017-05-16 DIAGNOSIS — K5730 Diverticulosis of large intestine without perforation or abscess without bleeding: Secondary | ICD-10-CM | POA: Insufficient documentation

## 2017-05-16 DIAGNOSIS — R194 Change in bowel habit: Secondary | ICD-10-CM | POA: Insufficient documentation

## 2017-05-16 HISTORY — PX: COLONOSCOPY: SHX5424

## 2017-05-16 HISTORY — PX: POLYPECTOMY: SHX5525

## 2017-05-16 SURGERY — COLONOSCOPY
Anesthesia: Moderate Sedation

## 2017-05-16 MED ORDER — SODIUM CHLORIDE 0.9 % IV SOLN
INTRAVENOUS | Status: DC
  Administered 2017-05-16: 10:00:00 via INTRAVENOUS

## 2017-05-16 MED ORDER — STERILE WATER FOR IRRIGATION IR SOLN
Status: DC | PRN
  Administered 2017-05-16: 10:00:00

## 2017-05-16 MED ORDER — MEPERIDINE HCL 100 MG/ML IJ SOLN
INTRAMUSCULAR | Status: DC | PRN
  Administered 2017-05-16: 50 mg via INTRAVENOUS
  Administered 2017-05-16: 25 mg via INTRAVENOUS

## 2017-05-16 MED ORDER — MIDAZOLAM HCL 5 MG/5ML IJ SOLN
INTRAMUSCULAR | Status: AC
  Filled 2017-05-16: qty 10

## 2017-05-16 MED ORDER — ONDANSETRON HCL 4 MG/2ML IJ SOLN
INTRAMUSCULAR | Status: DC | PRN
  Administered 2017-05-16: 4 mg via INTRAVENOUS

## 2017-05-16 MED ORDER — MEPERIDINE HCL 100 MG/ML IJ SOLN
INTRAMUSCULAR | Status: AC
  Filled 2017-05-16: qty 2

## 2017-05-16 MED ORDER — MIDAZOLAM HCL 5 MG/5ML IJ SOLN
INTRAMUSCULAR | Status: DC | PRN
  Administered 2017-05-16: 2 mg via INTRAVENOUS
  Administered 2017-05-16 (×6): 1 mg via INTRAVENOUS

## 2017-05-16 MED ORDER — ONDANSETRON HCL 4 MG/2ML IJ SOLN
INTRAMUSCULAR | Status: AC
  Filled 2017-05-16: qty 2

## 2017-05-16 NOTE — Op Note (Signed)
[facility] Patient Name: Jim Wilson Procedure Date: 05/16/2017 9:49 AM MRN: [id number] Date of Birth: 1951-05-14 Attending MD: Norvel Richards , MD CSN: [id number] Age: 66 Admit Type: Outpatient Procedure:                Colonoscopy Indications:              Change in bowel habits, Weight loss Providers:                Norvel Richards, MD, Otis Peak B. Gwenlyn Perking RN, RN,                            Aram Candela Referring MD:              Medicines:                Midazolam 8 mg IV, Meperidine 75 mg IV, Ondansetron                            4 mg IV Complications:            No immediate complications. Estimated Blood Loss:     Estimated blood loss was minimal. Procedure:                Pre-Anesthesia Assessment:                           - Prior to the procedure, a History and Physical                            was performed, and patient medications and                            allergies were reviewed. The patient's tolerance of                            previous anesthesia was also reviewed. The risks                            and benefits of the procedure and the sedation                            options and risks were discussed with the patient.                            All questions were answered, and informed consent                            was obtained. Prior Anticoagulants: The patient                            last took Coumadin (warfarin) 4 days prior to the                            procedure. ASA Grade Assessment: III - A patient  with severe systemic disease. After reviewing the                            risks and benefits, the patient was deemed in                            satisfactory condition to undergo the procedure.                           After obtaining informed consent, the colonoscope                            was passed under direct vision. Throughout the                            procedure, the  patient's blood pressure, pulse, and                            oxygen saturations were monitored continuously. The                            EC-[id number] ([id number]) scope was introduced through                            the anus with the intention of advancing to the                            cecum. The scope was advanced to the ascending                            colon before the procedure was aborted. Medications                            were given. The colonoscopy was unusually difficult                            due to significant looping. The patient tolerated                            the procedure well. The quality of the bowel                            preparation was adequate. The colonoscopy was                            aborted due to the difficulty of the procedure. The                            ileocecal valve and the rectum were photographed. Scope In: 10:10:38 AM Scope Out: 11:31:42 AM Total Procedure Duration: 1 hour 21 minutes 4 seconds  Findings:      The perianal and digital rectal examinations were normal.      Scattered small and large-mouthed diverticula were found in the sigmoid  colon and descending colon.      Two sessile polyps were found in the ascending colon. The polyps were 3       to 5 mm in size. These polyps were removed with a cold snare. Resection       and retrieval were complete. Estimated blood loss: none.      Two sessile polyps were found in the descending colon. The polyps were 6       to 10 mm in size. These polyps were removed with a hot snare. Resection       and retrieval were complete. Estimated blood loss: none.      A 4 mm polyp was found in the rectum. The polyp was sessile. The polyp       was removed with a cold snare. Resection and retrieval were complete.       Markedly redundant colon. An extended amount of time was taken utilizing       changing of the patient's position and external abdominal pressure.       Recurrent  looping. I was able to reach about 15 cm distal to the       ileocecal valve but was ultimately unable to deeply intubate the cecum.       scattered submucosal nodules throughout the colon of uncertain       significance. Impression:               - The procedure was aborted due to the difficulty                            of the procedure.                           - Moderate diverticulosis in the sigmoid colon and                            in the descending colon.                           - Two 3 to 5 mm polyps in the ascending colon,                            removed with a cold snare. Resected and retrieved.                           - Two 6 to 10 mm polyps in the descending colon,                            removed with a hot snare. Resected and retrieved.                           - One 4 mm polyp in the rectum, removed with a cold                            snare. Resected and retrieved. Submucosal nodules                            throughout the colon may  be related to oral and                            cutaneous lesion seen previously. Moderate Sedation:      Moderate (conscious) sedation was administered by the endoscopy nurse       and supervised by the endoscopist. The following parameters were       monitored: oxygen saturation, heart rate, blood pressure, respiratory       rate, EKG, adequacy of pulmonary ventilation, and response to care.       Total physician intraservice time was 85 minutes. Recommendation:           - Patient has a contact number available for                            emergencies. The signs and symptoms of potential                            delayed complications were discussed with the                            patient. Return to normal activities tomorrow.                            Written discharge instructions were provided to the                            patient.                           - Continue present medications.                            - Await pathology results.                           - Repeat colonoscopy date to be determined after                            pending pathology results are reviewed for                            surveillance.                           - Return to GI clinic (date not yet determined).                           - Resume Coumadin (warfarin) at prior dose today.                            Refer to Coumadin Clinic for further adjustment of                            therapy. No Lovenox bridging post procedure due to  risk of post polypectomy bleeding. Air contrast                            barium enema in one month to image cecal area not                            seen today.                           - Patient has a contact number available for                            emergencies. The signs and symptoms of potential                            delayed complications were discussed with the                            patient. Return to normal activities tomorrow.                            Written discharge instructions were provided to the                            patient. Procedure Code(s):        --- Professional ---                           [phone], 52, Colonoscopy, flexible; with removal of                            tumor(s), polyp(s), or other lesion(s) by snare                            technique                           99152, Moderate sedation services provided by the                            same physician or other qualified health care                            professional performing the diagnostic or                            therapeutic service that the sedation supports,                            requiring the presence of an independent trained                            observer to assist in the monitoring of the  patient's level of consciousness and physiological                            status;  initial 15 minutes of intraservice time,                            patient age 66 years or older                           [phone], Moderate sedation services; each additional                            15 minutes intraservice time                           99153, Moderate sedation services; each additional                            15 minutes intraservice time                           99153, Moderate sedation services; each additional                            15 minutes intraservice time                           99153, Moderate sedation services; each additional                            15 minutes intraservice time                           99153, Moderate sedation services; each additional                            15 minutes intraservice time Diagnosis Code(s):        --- Professional ---                           Z53.8, Procedure and treatment not carried out for                            other reasons                           D12.2, Benign neoplasm of ascending colon                           D12.4, Benign neoplasm of descending colon                           K62.1, Rectal polyp                           R19.4, Change in bowel habit  R63.4, Abnormal weight loss                           K57.30, Diverticulosis of large intestine without                            perforation or abscess without bleeding CPT copyright 2016 American [employer] Association. All rights reserved. The codes documented in this report are preliminary and upon coder review may  be revised to meet current compliance requirements. Cristopher Estimable. Daune Colgate, MD Norvel Richards, MD 05/16/2017 12:39:37 PM This report has been signed electronically. Number of Addenda: 0

## 2017-05-16 NOTE — H&P (Signed)
@LOGO @   Primary Care Physician:  Rory Percy, MD Primary Gastroenterologist:  Dr. Gala Romney  Pre-Procedure History & Physical: HPI:  Jim Wilson is a 66 y.o. male here for here for diagnostic colonoscopy. History weight loss. Coumadin held. Bridged with Lovenox.  Past Medical History:  Diagnosis Date  . A-fib ([place])   . Cancer ([place])    hx of skin cancers.   Mom & Dad passed away from lung cancer  . COPD (chronic obstructive pulmonary disease) ([place])   . 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 Dysrhythmia    A-FIB    . GERD (gastroesophageal reflux disease)   . History of hiatal hernia   . Kidney stones   . Shortness of breath dyspnea   . Thyroid cancer ([place]) 2016  . Thyroid mass of unclear etiology    reason for doing this surgery  . Wolff-Parkinson-White (WPW) syndrome    had 2 different ablations.  One here @ [facility], the other at [facility]    Past Surgical History:  Procedure Laterality Date  . ABLATION     for WPW  . bladder surgery 4-5 yrs ago.    . CHOLECYSTECTOMY    . COLONOSCOPY  2005  . COLONOSCOPY N/A 02/23/2017   Dr. Gala Romney: Abnormal appearing anal skin, rectum and perianal mucosa demonstrated multiple nodularities similar appearance to lesions seen in the upper GI tract. Colon prep poor/inadequate. Colon polyps versus nodules not manipulated. Plans for another attempt at colonoscopy in the near future.  . ESOPHAGOGASTRODUODENOSCOPY N/A 02/23/2017   Dr. Gala Romney: Hard palate and hypopharynx revealed markedly nodular, deformed, abnormal appearing uvula, papillomatous appearing lesions around the retinoid cartilage. Esophagus has similar nodules completely lining the entire esophageal body. Question of short seg Barrett's (bx neg). Numerous nodules lining the gastric mucosa. 2-3 larger ones measuring up to 2 cm. Esophagus dilated. bx: gastrit  . EXCISION OF TONGUE LESION    . EYE SURGERY     bilateral cataracts  . HERNIA  REPAIR     Inguinal hernia on left  . KNEE ARTHROSCOPY W/ MENISCAL REPAIR     on the right knee  . MALONEY DILATION N/A 02/23/2017   Procedure: [name] DILATION;  Surgeon: Daneil Dolin, MD;  Location: [facility];  Service: Endoscopy;  Laterality: N/A;  . skin cancer removed from scalp    . THYROIDECTOMY Right 06/03/2015   Procedure: RIGHT HEMI THYROIDECTOMY;  Surgeon: Leta Baptist, MD;  Location: [place];  Service: ENT;  Laterality: Right;  . TONSILLECTOMY      Prior to Admission medications   Medication Sig Start Date End Date Taking? Authorizing Provider  albuterol (PROVENTIL) (2.5 MG/3ML) 0.083% nebulizer solution Take 2.5 mg by nebulization every 6 (six) hours as needed for wheezing or shortness of breath.   Yes [provider]  cetirizine (ZYRTEC) 10 MG tablet Take 10 mg by mouth daily as needed for allergies.    Yes [provider]  diltiazem (CARDIZEM CD) 120 MG 24 hr capsule Take 120 mg by mouth daily.  03/28/15  Yes [provider]  enoxaparin (LOVENOX) 150 MG/ML injection Inject 0.9 mLs (135 mg total) into the skin every 12 (twelve) hours. As directed on lovenox bridge instructions provided. 04/02/17  Yes Mahala Menghini, PA-C  Fluticasone-Salmeterol (ADVAIR) 250-50 MCG/DOSE AEPB Inhale 1 puff into the lungs 2 (two) times daily.    Yes [provider]  furosemide (LASIX) 40 MG tablet Take  40 mg by mouth 2 (two) times daily.  04/08/15  Yes [provider]  gabapentin (NEURONTIN) 300 MG capsule Take 300 mg by mouth 3 (three) times daily.   Yes [provider]  ibuprofen (ADVIL,MOTRIN) 200 MG tablet Take 400 mg by mouth every 8 (eight) hours as needed (for pain/headaches.).   Yes [provider]  ketoconazole (NIZORAL) 2 % cream Apply 1 application topically 3 (three) times daily as needed (applied to face for dry/irritated skin).  03/27/15  Yes [provider]  linaclotide Rolan Lipa) 145 MCG CAPS capsule Take 1 capsule (145  mcg total) by mouth daily before breakfast. 02/06/17  Yes Mahala Menghini, PA-C  omeprazole (PRILOSEC) 20 MG capsule Take 20 mg by mouth 2 (two) times daily.  04/03/15  Yes [provider]  polyethylene glycol-electrolytes (TRILYTE) 420 g solution Take 4,000 mLs by mouth as directed. 03/31/17  Yes Lida Berkery, Cristopher Estimable, MD  potassium chloride (K-DUR) 10 MEQ tablet Take 10 mEq by mouth daily.  04/08/15  Yes [provider]  simvastatin (ZOCOR) 20 MG tablet Take 20 mg by mouth at bedtime.  03/16/15  Yes [provider]  tamsulosin (FLOMAX) 0.4 MG CAPS capsule Take 0.4 mg by mouth 2 (two) times daily.  03/07/15  Yes [provider]  warfarin (COUMADIN) 3 MG tablet Take 3 mg by mouth daily. 01/21/17  Yes [provider]  polyethylene glycol-electrolytes (TRILYTE) 420 g solution Take 4,000 mLs by mouth as directed. Patient not taking: Reported on 05/10/2017 03/31/17   Daneil Dolin, MD    Allergies as of 03/31/2017  . (No Known Allergies)    Family History  Problem Relation Age of Onset  . Lung cancer Mother   . Cancer Mother   . Lung cancer Father   . Heart disease Father   . Liver disease Neg Hx   . Colon cancer Neg Hx     Social History   Social History  . Marital status: Single    Spouse name: N/A  . Number of children: N/A  . Years of education: N/A   Occupational History  . Not on file.   Social History Main Topics  . Smoking status: Never Smoker  . Smokeless tobacco: Never Used  . Alcohol use No  . Drug use: No  . Sexual activity: Not on file   Other Topics Concern  . Not on file   Social History Narrative  . No narrative on file    Review of Systems: See HPI, otherwise negative ROS  Physical Exam: BP 126/74   Pulse 74   Temp 98 F (36.7 C) (Oral)   Resp 17   Ht 6' (1.829 m)   Wt 296 lb (134.3 kg)   SpO2 97%   BMI 40.14 kg/m  General:   Alert,  Well-developed, well-nourished, pleasant and cooperative in NAD Neck:  Supple;  no masses or thyromegaly. No significant cervical adenopathy. Lungs:  Clear throughout to auscultation.   No wheezes, crackles, or rhonchi. No acute distress. Heart:  Regular rate and rhythm; no murmurs, clicks, rubs,  or gallops. Abdomen: Non-distended, normal bowel sounds.  Soft and nontender without appreciable mass or hepatosplenomegaly.  Pulses:  Normal pulses noted. Extremities:  Without clubbing or edema.  Impression; Pleasant 66 year old gentleman with weight loss.  Due For colonoscopy this time. Anticoagulation held.  Recommendations: I will offer the patient diagnostic colonoscopy today.  The risks, benefits, limitations, alternatives and imponderables have been reviewed with the patient. Questions have  been answered. All parties are agreeable.                   Notice: This dictation was prepared with Dragon dictation along with smaller phrase technology. Any transcriptional errors that result from this process are unintentional and may not be corrected upon review.

## 2017-05-16 NOTE — Discharge Instructions (Addendum)
Colonoscopy Discharge Instructions  Read the instructions outlined below and refer to this sheet in the next few weeks. These discharge instructions provide you with general information on caring for yourself after you leave the hospital. Your doctor may also give you specific instructions. While your treatment has been planned according to the most current medical practices available, unavoidable complications occasionally occur. If you have any problems or questions after discharge, call Dr. Gala Romney at [phone]. ACTIVITY  You may resume your regular activity, but move at a slower pace for the next 24 hours.   Take frequent rest periods for the next 24 hours.   Walking will help get rid of the air and reduce the bloated feeling in your belly (abdomen).   No driving for 24 hours (because of the medicine (anesthesia) used during the test).    Do not sign any important legal documents or operate any machinery for 24 hours (because of the anesthesia used during the test).  NUTRITION  Drink plenty of fluids.   You may resume your normal diet as instructed by your doctor.   Begin with a light meal and progress to your normal diet. Heavy or fried foods are harder to digest and may make you feel sick to your stomach (nauseated).   Avoid alcoholic beverages for 24 hours or as instructed.  MEDICATIONS  You may resume your normal medications unless your doctor tells you otherwise.  WHAT YOU CAN EXPECT TODAY  Some feelings of bloating in the abdomen.   Passage of more gas than usual.   Spotting of blood in your stool or on the toilet paper.  IF YOU HAD POLYPS REMOVED DURING THE COLONOSCOPY:  No aspirin products for 7 days or as instructed.   No alcohol for 7 days or as instructed.   Eat a soft diet for the next 24 hours.  FINDING OUT THE RESULTS OF YOUR TEST Not all test results are available during your visit. If your test results are not back during the visit, make an appointment  with your caregiver to find out the results. Do not assume everything is normal if you have not heard from your caregiver or the medical facility. It is important for you to follow up on all of your test results.  SEEK IMMEDIATE MEDICAL ATTENTION IF:  You have more than a spotting of blood in your stool.   Your belly is swollen (abdominal distention).   You are nauseated or vomiting.   You have a temperature over 101.   You have abdominal pain or discomfort that is severe or gets worse throughout the day.    Your colon was very long. The very end of it could not be examined. He had multiple polyps which I removed  Colon polyp and diverticulosis information provided  Begin Coumadin today. No Lovenox shots;  follow-up Coumadin clinic for INR. First of next week.  He will need a barium enema to image the colon not seen today in about one month  Further recommendations to follow pending review of pathology report.   Diverticulosis Diverticulosis is a condition that develops when small pouches (diverticula) form in the wall of the large intestine (colon). The colon is where water is absorbed and stool is formed. The pouches form when the inside layer of the colon pushes through weak spots in the outer layers of the colon. You may have a few pouches or many of them. What are the causes? The cause of this condition is not known. What  increases the risk? The following factors may make you more likely to develop this condition:  Being older than age 29. Your risk for this condition increases with age. Diverticulosis is rare among people younger than age 88. By age 32, many people have it.  Eating a low-fiber diet.  Having frequent constipation.  Being overweight.  Not getting enough exercise.  Smoking.  Taking over-the-counter pain medicines, like aspirin and ibuprofen.  Having a family history of diverticulosis.  What are the signs or symptoms? In most people, there are no  symptoms of this condition. If you do have symptoms, they may include:  Bloating.  Cramps in the abdomen.  Constipation or diarrhea.  Pain in the lower left side of the abdomen.  How is this diagnosed? This condition is most often diagnosed during an exam for other colon problems. Because diverticulosis usually has no symptoms, it often cannot be diagnosed independently. This condition may be diagnosed by:  Using a flexible scope to examine the colon (colonoscopy).  Taking an X-ray of the colon after dye has been put into the colon (barium enema).  Doing a CT scan.  How is this treated? You may not need treatment for this condition if you have never developed an infection related to diverticulosis. If you have had an infection before, treatment may include:  Eating a high-fiber diet. This may include eating more fruits, vegetables, and grains.  Taking a fiber supplement.  Taking a live bacteria supplement (probiotic).  Taking medicine to relax your colon.  Taking antibiotic medicines.  Follow these instructions at home:  Drink 6-8 glasses of water or more each day to prevent constipation.  Try not to strain when you have a bowel movement.  If you have had an infection before: ? Eat more fiber as directed by your health care provider or your diet and nutrition specialist (dietitian). ? Take a fiber supplement or probiotic, if your health care provider approves.  Take over-the-counter and prescription medicines only as told by your health care provider.  If you were prescribed an antibiotic, take it as told by your health care provider. Do not stop taking the antibiotic even if you start to feel better.  Keep all follow-up visits as told by your health care provider. This is important. Contact a health care provider if:  You have pain in your abdomen.  You have bloating.  You have cramps.  You have not had a bowel movement in 3 days. Get help right away  if:  Your pain gets worse.  Your bloating becomes very bad.  You have a fever or chills, and your symptoms suddenly get worse.  You vomit.  You have bowel movements that are bloody or black.  You have bleeding from your rectum. Summary  Diverticulosis is a condition that develops when small pouches (diverticula) form in the wall of the large intestine (colon).  You may have a few pouches or many of them.  This condition is most often diagnosed during an exam for other colon problems.  If you have had an infection related to diverticulosis, treatment may include increasing the fiber in your diet, taking supplements, or taking medicines. This information is not intended to replace advice given to you by your health care provider. Make sure you discuss any questions you have with your health care provider. Document Released: 08/18/2004 Document Revised: 10/10/2016 Document Reviewed: 10/10/2016 Elsevier Interactive Patient Education  2017 [employer].  Colon Polyps Polyps are tissue growths inside the body.  Polyps can grow in many places, including the large intestine (colon). A polyp may be a round bump or a mushroom-shaped growth. You could have one polyp or several. Most colon polyps are noncancerous (benign). However, some colon polyps can become cancerous over time. What are the causes? The exact cause of colon polyps is not known. What increases the risk? This condition is more likely to develop in people who:  Have a family history of colon cancer or colon polyps.  Are older than 6 or older than 45 if they are African American.  Have inflammatory bowel disease, such as ulcerative colitis or Crohn disease.  Are overweight.  Smoke cigarettes.  Do not get enough exercise.  Drink too much alcohol.  Eat a diet that is: ? High in fat and red meat. ? Low in fiber.  Had childhood cancer that was treated with abdominal radiation.  What are the signs or  symptoms? Most polyps do not cause symptoms. If you have symptoms, they may include:  Blood coming from your rectum when having a bowel movement.  Blood in your stool.The stool may look dark red or black.  A change in bowel habits, such as constipation or diarrhea.  How is this diagnosed? This condition is diagnosed with a colonoscopy. This is a procedure that uses a lighted, flexible scope to look at the inside of your colon. How is this treated? Treatment for this condition involves removing any polyps that are found. Those polyps will then be tested for cancer. If cancer is found, your health care provider will talk to you about options for colon cancer treatment. Follow these instructions at home: Diet  Eat plenty of fiber, such as fruits, vegetables, and whole grains.  Eat foods that are high in calcium and vitamin D, such as milk, cheese, yogurt, eggs, liver, fish, and broccoli.  Limit foods high in fat, red meats, and processed meats, such as hot dogs, sausage, bacon, and lunch meats.  Maintain a healthy weight, or lose weight if recommended by your health care provider. General instructions  Do not smoke cigarettes.  Do not drink alcohol excessively.  Keep all follow-up visits as told by your health care provider. This is important. This includes keeping regularly scheduled colonoscopies. Talk to your health care provider about when you need a colonoscopy.  Exercise every day or as told by your health care provider. Contact a health care provider if:  You have new or worsening bleeding during a bowel movement.  You have new or increased blood in your stool.  You have a change in bowel habits.  You unexpectedly lose weight. This information is not intended to replace advice given to you by your health care provider. Make sure you discuss any questions you have with your health care provider. Document Released: 08/17/2004 Document Revised: 04/28/2016 Document  Reviewed: 10/12/2015 Elsevier Interactive Patient Education  [employer].

## 2017-05-18 ENCOUNTER — Telehealth: Payer: Self-pay | Admitting: Internal Medicine

## 2017-05-18 ENCOUNTER — Encounter: Payer: Self-pay | Admitting: Internal Medicine

## 2017-05-18 ENCOUNTER — Telehealth: Payer: Self-pay

## 2017-05-18 NOTE — Progress Notes (Signed)
Postprocedural telephone call to patient.  "Some constipation, I can pass gas". Denies nausea, vomiting or pain.  Reassured patient and continue to drink plenty of water.  Patient agrees to call if problems develop.

## 2017-05-18 NOTE — Telephone Encounter (Signed)
Per RMR-  Rourk, Cristopher Estimable, MD  Claudina Lick, LPN; Theadora Rama        Send letter to patient.  Send copy of letter with path to referring provider and PCP.  Needs a barium enema in about one month to look at cecum.

## 2017-05-18 NOTE — Telephone Encounter (Signed)
Needs a barium enema in about one month to look at cecum.    Per RMR in JL phone encounter on 05/18/17

## 2017-05-18 NOTE — Telephone Encounter (Signed)
Letter mailed to the pt. 

## 2017-05-19 ENCOUNTER — Encounter (HOSPITAL_COMMUNITY): Payer: Self-pay | Admitting: Internal Medicine

## 2017-05-22 ENCOUNTER — Other Ambulatory Visit: Payer: Self-pay

## 2017-05-22 DIAGNOSIS — R194 Change in bowel habit: Secondary | ICD-10-CM

## 2017-05-22 DIAGNOSIS — R634 Abnormal weight loss: Secondary | ICD-10-CM

## 2017-05-22 NOTE — Telephone Encounter (Signed)
Barium enema scheduled for 06/21/17 at 11:00am, pt to arrive at 10:45am. Instructions mailed to pt. Called and informed pt.

## 2017-05-22 NOTE — Telephone Encounter (Signed)
See previous note. Barium enema is scheduled for 06/21/17 at 11:00am, pt to arrive at 10:45am. Instructions mailed and pt is aware.

## 2017-06-12 NOTE — Telephone Encounter (Signed)
Pt called office and LMOVM that he needed another copy of barium enema instructions. Instructions mailed to pt. Called and informed the pt.

## 2017-06-14 DIAGNOSIS — I482 Chronic atrial fibrillation: Secondary | ICD-10-CM | POA: Diagnosis not present

## 2017-06-21 ENCOUNTER — Ambulatory Visit (HOSPITAL_COMMUNITY)
Admission: RE | Admit: 2017-06-21 | Discharge: 2017-06-21 | Disposition: A | Discharge status: Home / Self Care | Payer: PPO | Source: Ambulatory Visit | Attending: Internal Medicine | Admitting: Internal Medicine

## 2017-06-21 DIAGNOSIS — R194 Change in bowel habit: Secondary | ICD-10-CM | POA: Diagnosis not present

## 2017-06-21 DIAGNOSIS — R634 Abnormal weight loss: Secondary | ICD-10-CM | POA: Insufficient documentation

## 2017-06-21 DIAGNOSIS — K5730 Diverticulosis of large intestine without perforation or abscess without bleeding: Secondary | ICD-10-CM | POA: Insufficient documentation

## 2017-06-26 ENCOUNTER — Other Ambulatory Visit: Payer: Self-pay

## 2017-06-26 DIAGNOSIS — R194 Change in bowel habit: Secondary | ICD-10-CM

## 2017-06-29 ENCOUNTER — Telehealth: Payer: Self-pay

## 2017-06-29 NOTE — Telephone Encounter (Signed)
Called [facility] to f/u on referral that was faxed 06/26/17 for TCS with overtube and fluroscopy. Receptionist said someone had made a note that the information didn't have an indication. After she reviewed the faxed info she found the info that was needed. Stated she would take care of it.

## 2017-07-04 NOTE — Telephone Encounter (Signed)
Called [facility] to f/u on referral. Info has been sent for review. Receptionist said nothing has been scheduled yet and he may need an OV first.

## 2017-07-06 ENCOUNTER — Telehealth: Payer: Self-pay | Admitting: Internal Medicine

## 2017-07-06 NOTE — Telephone Encounter (Signed)
See separate phone note about f/u on [facility] referral. Called pt and informed him I had called twice to f/u on referral. The last time I called, his referral had been sent for review and he may need clinic visit before procedure can be scheduled. Gave him phone number to [facility] procedures dept [phone]) in case he needs to call them.

## 2017-07-06 NOTE — Telephone Encounter (Signed)
Pt called to follow up on a possible referral to [facility]. Please call him back at [phone]

## 2017-07-11 ENCOUNTER — Other Ambulatory Visit: Payer: Self-pay

## 2017-07-11 NOTE — Telephone Encounter (Signed)
Called [facility], pt has appt for Colonoscopy 09/08/17 at 11:30am with Dr. Arsenio Loader.

## 2017-07-11 NOTE — Telephone Encounter (Signed)
Called [facility], pt has appt for Colonoscopy 09/08/17 at 11:30am with Dr. Arsenio Loader.

## 2017-07-26 DIAGNOSIS — I82409 Acute embolism and thrombosis of unspecified deep veins of unspecified lower extremity: Secondary | ICD-10-CM | POA: Diagnosis not present

## 2017-07-26 DIAGNOSIS — I482 Chronic atrial fibrillation: Secondary | ICD-10-CM | POA: Diagnosis not present

## 2017-08-03 DIAGNOSIS — Z6841 Body Mass Index (BMI) 40.0 and over, adult: Secondary | ICD-10-CM | POA: Diagnosis not present

## 2017-08-03 DIAGNOSIS — S8001XA Contusion of right knee, initial encounter: Secondary | ICD-10-CM | POA: Diagnosis not present

## 2017-08-03 DIAGNOSIS — S8002XA Contusion of left knee, initial encounter: Secondary | ICD-10-CM | POA: Diagnosis not present

## 2017-08-14 DIAGNOSIS — Z23 Encounter for immunization: Secondary | ICD-10-CM | POA: Diagnosis not present

## 2017-08-15 DIAGNOSIS — S8001XD Contusion of right knee, subsequent encounter: Secondary | ICD-10-CM | POA: Diagnosis not present

## 2017-08-15 DIAGNOSIS — Z6841 Body Mass Index (BMI) 40.0 and over, adult: Secondary | ICD-10-CM | POA: Diagnosis not present

## 2017-08-25 DIAGNOSIS — Z9889 Other specified postprocedural states: Secondary | ICD-10-CM | POA: Diagnosis not present

## 2017-08-25 DIAGNOSIS — Z7901 Long term (current) use of anticoagulants: Secondary | ICD-10-CM | POA: Diagnosis not present

## 2017-08-25 DIAGNOSIS — J449 Chronic obstructive pulmonary disease, unspecified: Secondary | ICD-10-CM | POA: Diagnosis not present

## 2017-08-25 DIAGNOSIS — R0683 Snoring: Secondary | ICD-10-CM | POA: Diagnosis not present

## 2017-08-25 DIAGNOSIS — K589 Irritable bowel syndrome without diarrhea: Secondary | ICD-10-CM | POA: Diagnosis not present

## 2017-08-25 DIAGNOSIS — I456 Pre-excitation syndrome: Secondary | ICD-10-CM | POA: Diagnosis not present

## 2017-08-25 DIAGNOSIS — Z9189 Other specified personal risk factors, not elsewhere classified: Secondary | ICD-10-CM | POA: Diagnosis not present

## 2017-08-25 DIAGNOSIS — K219 Gastro-esophageal reflux disease without esophagitis: Secondary | ICD-10-CM | POA: Diagnosis not present

## 2017-08-25 DIAGNOSIS — R600 Localized edema: Secondary | ICD-10-CM | POA: Diagnosis not present

## 2017-08-25 DIAGNOSIS — Z85850 Personal history of malignant neoplasm of thyroid: Secondary | ICD-10-CM | POA: Diagnosis not present

## 2017-08-25 DIAGNOSIS — R9431 Abnormal electrocardiogram [ECG] [EKG]: Secondary | ICD-10-CM | POA: Diagnosis not present

## 2017-08-25 DIAGNOSIS — E785 Hyperlipidemia, unspecified: Secondary | ICD-10-CM | POA: Diagnosis not present

## 2017-08-25 DIAGNOSIS — Z01818 Encounter for other preprocedural examination: Secondary | ICD-10-CM | POA: Diagnosis not present

## 2017-08-25 DIAGNOSIS — N400 Enlarged prostate without lower urinary tract symptoms: Secondary | ICD-10-CM | POA: Diagnosis not present

## 2017-08-25 DIAGNOSIS — Z86718 Personal history of other venous thrombosis and embolism: Secondary | ICD-10-CM | POA: Diagnosis not present

## 2017-08-25 DIAGNOSIS — G4700 Insomnia, unspecified: Secondary | ICD-10-CM | POA: Diagnosis not present

## 2017-08-25 DIAGNOSIS — I4510 Unspecified right bundle-branch block: Secondary | ICD-10-CM | POA: Diagnosis not present

## 2017-08-25 DIAGNOSIS — E669 Obesity, unspecified: Secondary | ICD-10-CM | POA: Diagnosis not present

## 2017-08-25 DIAGNOSIS — I4891 Unspecified atrial fibrillation: Secondary | ICD-10-CM | POA: Diagnosis not present

## 2017-08-31 ENCOUNTER — Ambulatory Visit (INDEPENDENT_AMBULATORY_CARE_PROVIDER_SITE_OTHER): Payer: Self-pay | Admitting: Gastroenterology

## 2017-08-31 ENCOUNTER — Encounter: Payer: Self-pay | Admitting: Gastroenterology

## 2017-08-31 VITALS — BP 109/71 | HR 60 | Temp 97.0°F | Ht 73.0 in | Wt 314.6 lb

## 2017-08-31 DIAGNOSIS — K7460 Unspecified cirrhosis of liver: Secondary | ICD-10-CM

## 2017-08-31 NOTE — Progress Notes (Signed)
Please request copy of last two OVs with Dr. Benjamine Mola.

## 2017-08-31 NOTE — Progress Notes (Signed)
I sent a request to Dr Deeann Saint office to get the last 2 office notes. They may require a release. If so, I will mail one to the patient.

## 2017-08-31 NOTE — Assessment & Plan Note (Signed)
Well compensated cirrhosis. On coumadin for Afib and h/o DVT, so MELD cannot be calculated. No varices on EGD. Due for hepatoma screening and labs. He completed Hep A and B vaccinations. Return to office in six months.

## 2017-08-31 NOTE — Patient Instructions (Signed)
1. Please have your labs and ultrasound done as discussed.  2. Good luck on the colonoscopy! We will await records.  3. See you back in 6 months.

## 2017-08-31 NOTE — Progress Notes (Signed)
Primary Care Physician: Rory Percy, MD  Primary Gastroenterologist:  Garfield Cornea, MD   Chief Complaint  Patient presents with  . Cirrhosis    HPI: Jim Wilson is a 66 y.o. male here for follow up of cirrhosis. He is scheduled for colonoscopy at [facility] due to failure to clear colon on two attempts locally and by Barium enema and with h/o adenomatous colon polyps.   In March she had an attempted colonoscopy but bowel prep wasn't adequate and precluded exam. EGD at that time showed markedly nodular, deformed, hard palate and hypopharynx and abnormal appearing uvula, papillomatosis appearing lesions around the retinoid cartilage. Esophagus had similar nodules throughout the entire body. There was question of short segment Barrett's but biopsy did not confirm. Numerous nodules lining the gastric mucosa. Esophagus was dilated. Biopsies from the esophagus was unremarkable, gastric biopsies without H pylori that he did have gastritis. His rectum and perianal mucosa had multiple nodularities last papillomatous appearing lesions as that seen in the upper GI tract. Repeat colonoscopy June 2018 showed greater than 5 tumor adenomas removed, the cecum could not be reached. In addition barium enema was performed without the ability to clear the right colon.  Patient was referred to ENT for abnormal hard palate and hypopharynx, etc.  Patient also has a history of well compensated cirrhosis. On Coumadin for A. fib and history of DVT therefore meld cannot be calculated. No evidence of portal hypertension on previous EGD this year. He has been vaccinated for hepatitis A and B. He is due for 6 month labs and hepatoma screening. Weight is up 20 pounds in past 3 months. No further abd pain. No vomiting. No heartburn. BM regular on Linzess. No melena, brbpr.     Current Outpatient Prescriptions  Medication Sig Dispense Refill  . albuterol (PROVENTIL) (2.5 MG/3ML) 0.083% nebulizer solution Take  2.5 mg by nebulization every 6 (six) hours as needed for wheezing or shortness of breath.    . cetirizine (ZYRTEC) 10 MG tablet Take 10 mg by mouth daily as needed for allergies.     Marland Kitchen diltiazem (CARDIZEM CD) 120 MG 24 hr capsule Take 120 mg by mouth daily.     . Fluticasone-Salmeterol (ADVAIR) 250-50 MCG/DOSE AEPB Inhale 1 puff into the lungs 2 (two) times daily.     . furosemide (LASIX) 40 MG tablet Take 40 mg by mouth 2 (two) times daily.     Marland Kitchen gabapentin (NEURONTIN) 300 MG capsule Take 300 mg by mouth 3 (three) times daily.    Marland Kitchen ibuprofen (ADVIL,MOTRIN) 200 MG tablet Take 400 mg by mouth every 8 (eight) hours as needed (for pain/headaches.).    Marland Kitchen ketoconazole (NIZORAL) 2 % cream Apply 1 application topically 3 (three) times daily as needed (applied to face for dry/irritated skin).     Marland Kitchen linaclotide (LINZESS) 145 MCG CAPS capsule Take 1 capsule (145 mcg total) by mouth daily before breakfast. 30 capsule 3  . omeprazole (PRILOSEC) 20 MG capsule Take 20 mg by mouth 2 (two) times daily.     . potassium chloride (K-DUR) 10 MEQ tablet Take 10 mEq by mouth daily.     . simvastatin (ZOCOR) 20 MG tablet Take 20 mg by mouth at bedtime.     . tamsulosin (FLOMAX) 0.4 MG CAPS capsule Take 0.4 mg by mouth 2 (two) times daily.     Marland Kitchen warfarin (COUMADIN) 3 MG tablet Take 3 mg by mouth daily.     No current facility-administered medications  for this visit.     Allergies as of 08/31/2017  . (No Known Allergies)    ROS:  General: Negative for anorexia, weight loss, fever, chills, fatigue, weakness. ENT: Negative for hoarseness, difficulty swallowing , nasal congestion. CV: Negative for chest pain, angina, palpitations, dyspnea on exertion, peripheral edema.  Respiratory: Negative for dyspnea at rest, dyspnea on exertion, cough, sputum, wheezing.  GI: See history of present illness. GU:  Negative for dysuria, hematuria, urinary incontinence, urinary frequency, nocturnal urination.  Endo: Negative for  unusual weight change.    Physical Examination:   BP 109/71   Pulse 60   Temp (!) 97 F (36.1 C) (Oral)   Ht 6\' 1"  (1.854 m)   Wt (!) 314 lb 9.6 oz (142.7 kg)   BMI 41.51 kg/m   General: Well-nourished, well-developed in no acute distress.  Eyes: No icterus. Mouth: Oropharyngeal mucosa moist and pink , no lesions erythema or exudate. Lungs: Clear to auscultation bilaterally.  Heart: Regular rate and rhythm, no murmurs rubs or gallops.  Abdomen: Bowel sounds are normal, nontender, nondistended but obese.   Extremities: chronic venous stasus changes. 1+lower extremity edema. No clubbing or deformities. Neuro: Alert and oriented x 4   Skin: Warm and dry, no jaundice.   Psych: Alert and cooperative, normal mood and affect.   Imaging Studies: No results found.

## 2017-09-01 NOTE — Progress Notes (Signed)
cc'ed to pcp °

## 2017-09-06 ENCOUNTER — Ambulatory Visit (HOSPITAL_COMMUNITY): Admission: RE | Admit: 2017-09-06 | Payer: PPO | Source: Ambulatory Visit

## 2017-09-06 DIAGNOSIS — Z23 Encounter for immunization: Secondary | ICD-10-CM | POA: Diagnosis not present

## 2017-09-18 DIAGNOSIS — I482 Chronic atrial fibrillation: Secondary | ICD-10-CM | POA: Diagnosis not present

## 2017-09-18 DIAGNOSIS — I82409 Acute embolism and thrombosis of unspecified deep veins of unspecified lower extremity: Secondary | ICD-10-CM | POA: Diagnosis not present

## 2017-10-03 DIAGNOSIS — I482 Chronic atrial fibrillation: Secondary | ICD-10-CM | POA: Diagnosis not present

## 2017-10-03 DIAGNOSIS — D649 Anemia, unspecified: Secondary | ICD-10-CM | POA: Diagnosis not present

## 2017-10-03 DIAGNOSIS — E042 Nontoxic multinodular goiter: Secondary | ICD-10-CM | POA: Diagnosis not present

## 2017-10-03 DIAGNOSIS — Z7901 Long term (current) use of anticoagulants: Secondary | ICD-10-CM | POA: Diagnosis not present

## 2017-10-03 DIAGNOSIS — E7800 Pure hypercholesterolemia, unspecified: Secondary | ICD-10-CM | POA: Diagnosis not present

## 2017-10-03 DIAGNOSIS — I82409 Acute embolism and thrombosis of unspecified deep veins of unspecified lower extremity: Secondary | ICD-10-CM | POA: Diagnosis not present

## 2017-10-03 DIAGNOSIS — I10 Essential (primary) hypertension: Secondary | ICD-10-CM | POA: Diagnosis not present

## 2017-10-05 DIAGNOSIS — I809 Phlebitis and thrombophlebitis of unspecified site: Secondary | ICD-10-CM | POA: Diagnosis not present

## 2017-10-05 DIAGNOSIS — J441 Chronic obstructive pulmonary disease with (acute) exacerbation: Secondary | ICD-10-CM | POA: Diagnosis not present

## 2017-10-05 DIAGNOSIS — Z6841 Body Mass Index (BMI) 40.0 and over, adult: Secondary | ICD-10-CM | POA: Diagnosis not present

## 2017-10-05 DIAGNOSIS — Z0000 Encounter for general adult medical examination without abnormal findings: Secondary | ICD-10-CM | POA: Diagnosis not present

## 2017-10-05 DIAGNOSIS — E042 Nontoxic multinodular goiter: Secondary | ICD-10-CM | POA: Diagnosis not present

## 2017-10-05 DIAGNOSIS — I482 Chronic atrial fibrillation: Secondary | ICD-10-CM | POA: Diagnosis not present

## 2017-10-05 DIAGNOSIS — I10 Essential (primary) hypertension: Secondary | ICD-10-CM | POA: Diagnosis not present

## 2017-10-17 ENCOUNTER — Telehealth: Payer: Self-pay | Admitting: Internal Medicine

## 2017-10-17 NOTE — Telephone Encounter (Signed)
[phone] PLEASE CALL PATIENT, HE HAS QUESTIONS ABOUT PATIENT ASSISTANCE FORMS HE GOT IN THE MAIL

## 2017-10-17 NOTE — Telephone Encounter (Signed)
Spoke with pt, he has filled out his part for Linzess pt assistance and wanted to know if our office could fill out our part of under the provider section. Pt was asked to bring forms in to the office so we can fill out our part.

## 2017-10-30 DIAGNOSIS — I82409 Acute embolism and thrombosis of unspecified deep veins of unspecified lower extremity: Secondary | ICD-10-CM | POA: Diagnosis not present

## 2017-11-06 DIAGNOSIS — J441 Chronic obstructive pulmonary disease with (acute) exacerbation: Secondary | ICD-10-CM | POA: Diagnosis not present

## 2017-11-06 DIAGNOSIS — J189 Pneumonia, unspecified organism: Secondary | ICD-10-CM | POA: Diagnosis not present

## 2017-11-10 DIAGNOSIS — I4891 Unspecified atrial fibrillation: Secondary | ICD-10-CM | POA: Diagnosis not present

## 2017-11-10 DIAGNOSIS — Z86718 Personal history of other venous thrombosis and embolism: Secondary | ICD-10-CM | POA: Diagnosis not present

## 2017-11-10 DIAGNOSIS — Z7901 Long term (current) use of anticoagulants: Secondary | ICD-10-CM | POA: Diagnosis not present

## 2017-11-14 NOTE — Progress Notes (Signed)
Jim Wilson, I still have not seen any records.

## 2017-11-15 ENCOUNTER — Other Ambulatory Visit: Payer: Self-pay | Admitting: Gastroenterology

## 2017-11-15 MED ORDER — LINACLOTIDE 145 MCG PO CAPS: 145.0000 ug | ORAL_CAPSULE | Freq: Every day | ORAL | 3 refills | Status: AC

## 2017-11-15 NOTE — Progress Notes (Signed)
Written rx done for patient assistance.

## 2017-11-15 NOTE — Progress Notes (Signed)
I have mailed another release to the patient to sign and get back to me. Dr Benjamine Mola will not release records without one.

## 2017-11-15 NOTE — Progress Notes (Signed)
LMOM for patient to call me regarding getting records from Dr Deeann Saint office

## 2017-11-24 DIAGNOSIS — D122 Benign neoplasm of ascending colon: Secondary | ICD-10-CM | POA: Diagnosis not present

## 2017-11-24 DIAGNOSIS — I456 Pre-excitation syndrome: Secondary | ICD-10-CM | POA: Diagnosis not present

## 2017-11-24 DIAGNOSIS — Z86711 Personal history of pulmonary embolism: Secondary | ICD-10-CM | POA: Diagnosis not present

## 2017-11-24 DIAGNOSIS — K5730 Diverticulosis of large intestine without perforation or abscess without bleeding: Secondary | ICD-10-CM | POA: Diagnosis not present

## 2017-11-24 DIAGNOSIS — D128 Benign neoplasm of rectum: Secondary | ICD-10-CM | POA: Diagnosis not present

## 2017-11-24 DIAGNOSIS — Z7901 Long term (current) use of anticoagulants: Secondary | ICD-10-CM | POA: Diagnosis not present

## 2017-11-24 DIAGNOSIS — K635 Polyp of colon: Secondary | ICD-10-CM | POA: Diagnosis not present

## 2017-11-24 DIAGNOSIS — K5939 Other megacolon: Secondary | ICD-10-CM | POA: Diagnosis not present

## 2017-11-24 DIAGNOSIS — N400 Enlarged prostate without lower urinary tract symptoms: Secondary | ICD-10-CM | POA: Diagnosis not present

## 2017-11-24 DIAGNOSIS — K621 Rectal polyp: Secondary | ICD-10-CM | POA: Diagnosis not present

## 2017-11-24 DIAGNOSIS — Z86010 Personal history of colonic polyps: Secondary | ICD-10-CM | POA: Diagnosis not present

## 2017-11-24 DIAGNOSIS — Z85850 Personal history of malignant neoplasm of thyroid: Secondary | ICD-10-CM | POA: Diagnosis not present

## 2017-11-24 DIAGNOSIS — I4891 Unspecified atrial fibrillation: Secondary | ICD-10-CM | POA: Diagnosis not present

## 2017-11-24 DIAGNOSIS — K219 Gastro-esophageal reflux disease without esophagitis: Secondary | ICD-10-CM | POA: Diagnosis not present

## 2017-11-24 DIAGNOSIS — Z6841 Body Mass Index (BMI) 40.0 and over, adult: Secondary | ICD-10-CM | POA: Diagnosis not present

## 2017-11-24 DIAGNOSIS — E785 Hyperlipidemia, unspecified: Secondary | ICD-10-CM | POA: Diagnosis not present

## 2017-11-24 DIAGNOSIS — D124 Benign neoplasm of descending colon: Secondary | ICD-10-CM | POA: Diagnosis not present

## 2017-11-24 DIAGNOSIS — K589 Irritable bowel syndrome without diarrhea: Secondary | ICD-10-CM | POA: Diagnosis not present

## 2017-11-24 DIAGNOSIS — Z9889 Other specified postprocedural states: Secondary | ICD-10-CM | POA: Diagnosis not present

## 2017-11-24 DIAGNOSIS — J449 Chronic obstructive pulmonary disease, unspecified: Secondary | ICD-10-CM | POA: Diagnosis not present

## 2017-11-24 DIAGNOSIS — R600 Localized edema: Secondary | ICD-10-CM | POA: Diagnosis not present

## 2017-11-24 DIAGNOSIS — Z86718 Personal history of other venous thrombosis and embolism: Secondary | ICD-10-CM | POA: Diagnosis not present

## 2017-12-06 DIAGNOSIS — Z6841 Body Mass Index (BMI) 40.0 and over, adult: Secondary | ICD-10-CM | POA: Diagnosis not present

## 2017-12-06 DIAGNOSIS — M19011 Primary osteoarthritis, right shoulder: Secondary | ICD-10-CM | POA: Diagnosis not present

## 2017-12-06 DIAGNOSIS — R252 Cramp and spasm: Secondary | ICD-10-CM | POA: Diagnosis not present

## 2017-12-11 DIAGNOSIS — E782 Mixed hyperlipidemia: Secondary | ICD-10-CM | POA: Diagnosis not present

## 2017-12-14 ENCOUNTER — Telehealth: Payer: Self-pay | Admitting: Gastroenterology

## 2017-12-14 NOTE — Telephone Encounter (Signed)
Received copy of colonoscopy dated 11/24/2017 by Dr. Olegario Messier at [facility].  Difficult exam.  Large alpha loop noted with fluoroscopy.  Reducible with use of overtube.  Very redundant colon including the right colon.  Cecum could not be reached.  Colonoscopy completed to the ascending colon, tubular adenoma removed from the ascending colon.  benign polyps from the left colon and rectum.  Plan for repeat colonoscopy in 5 years.  CT colonograpy not suggested given recent CT imaging per Dr. Arsenio Loader.  Please NIC for colonoscopy in 11/2023 at [facility]

## 2017-12-14 NOTE — Telephone Encounter (Signed)
ON RECALL  °

## 2017-12-25 DIAGNOSIS — I482 Chronic atrial fibrillation: Secondary | ICD-10-CM | POA: Diagnosis not present

## 2018-01-01 ENCOUNTER — Encounter: Payer: Self-pay | Admitting: Internal Medicine

## 2018-01-05 DIAGNOSIS — E042 Nontoxic multinodular goiter: Secondary | ICD-10-CM | POA: Diagnosis not present

## 2018-01-05 DIAGNOSIS — E559 Vitamin D deficiency, unspecified: Secondary | ICD-10-CM | POA: Diagnosis not present

## 2018-01-05 DIAGNOSIS — R5383 Other fatigue: Secondary | ICD-10-CM | POA: Diagnosis not present

## 2018-01-08 DIAGNOSIS — Z6841 Body Mass Index (BMI) 40.0 and over, adult: Secondary | ICD-10-CM | POA: Diagnosis not present

## 2018-01-08 DIAGNOSIS — I482 Chronic atrial fibrillation: Secondary | ICD-10-CM | POA: Diagnosis not present

## 2018-01-08 DIAGNOSIS — E039 Hypothyroidism, unspecified: Secondary | ICD-10-CM | POA: Diagnosis not present

## 2018-01-17 DIAGNOSIS — L918 Other hypertrophic disorders of the skin: Secondary | ICD-10-CM | POA: Diagnosis not present

## 2018-02-02 DIAGNOSIS — Z0000 Encounter for general adult medical examination without abnormal findings: Secondary | ICD-10-CM | POA: Diagnosis not present

## 2018-02-02 DIAGNOSIS — E039 Hypothyroidism, unspecified: Secondary | ICD-10-CM | POA: Diagnosis not present

## 2018-02-02 DIAGNOSIS — B079 Viral wart, unspecified: Secondary | ICD-10-CM | POA: Diagnosis not present

## 2018-02-02 DIAGNOSIS — Z6841 Body Mass Index (BMI) 40.0 and over, adult: Secondary | ICD-10-CM | POA: Diagnosis not present

## 2018-02-02 DIAGNOSIS — I10 Essential (primary) hypertension: Secondary | ICD-10-CM | POA: Diagnosis not present

## 2018-02-02 DIAGNOSIS — E782 Mixed hyperlipidemia: Secondary | ICD-10-CM | POA: Diagnosis not present

## 2018-02-02 DIAGNOSIS — I482 Chronic atrial fibrillation: Secondary | ICD-10-CM | POA: Diagnosis not present

## 2018-02-02 DIAGNOSIS — N400 Enlarged prostate without lower urinary tract symptoms: Secondary | ICD-10-CM | POA: Diagnosis not present

## 2018-02-05 ENCOUNTER — Telehealth: Payer: Self-pay

## 2018-02-05 DIAGNOSIS — I482 Chronic atrial fibrillation: Secondary | ICD-10-CM | POA: Diagnosis not present

## 2018-02-05 NOTE — Telephone Encounter (Signed)
Patient Assistance paperwork completed and mailed to the [facility] for Linzess 145 mcg.

## 2018-02-08 ENCOUNTER — Ambulatory Visit (INDEPENDENT_AMBULATORY_CARE_PROVIDER_SITE_OTHER): Payer: PPO | Admitting: Otolaryngology

## 2018-02-08 DIAGNOSIS — D3709 Neoplasm of uncertain behavior of other specified sites of the oral cavity: Secondary | ICD-10-CM | POA: Diagnosis not present

## 2018-02-13 ENCOUNTER — Other Ambulatory Visit: Payer: Self-pay | Admitting: Otolaryngology

## 2018-02-15 ENCOUNTER — Encounter (HOSPITAL_BASED_OUTPATIENT_CLINIC_OR_DEPARTMENT_OTHER): Payer: Self-pay | Admitting: *Deleted

## 2018-02-15 ENCOUNTER — Other Ambulatory Visit: Payer: Self-pay

## 2018-02-20 ENCOUNTER — Encounter (HOSPITAL_BASED_OUTPATIENT_CLINIC_OR_DEPARTMENT_OTHER)
Admission: RE | Disposition: A | Discharge status: Home / Self Care | Payer: Self-pay | Source: Ambulatory Visit | Attending: Otolaryngology

## 2018-02-20 ENCOUNTER — Ambulatory Visit (HOSPITAL_BASED_OUTPATIENT_CLINIC_OR_DEPARTMENT_OTHER)
Admission: RE | Admit: 2018-02-20 | Discharge: 2018-02-20 | Disposition: A | Discharge status: Home / Self Care | Payer: PPO | Source: Ambulatory Visit | Attending: Otolaryngology | Admitting: Otolaryngology

## 2018-02-20 ENCOUNTER — Encounter (HOSPITAL_BASED_OUTPATIENT_CLINIC_OR_DEPARTMENT_OTHER): Payer: Self-pay

## 2018-02-20 DIAGNOSIS — D1039 Benign neoplasm of other parts of mouth: Secondary | ICD-10-CM | POA: Insufficient documentation

## 2018-02-20 DIAGNOSIS — D3709 Neoplasm of uncertain behavior of other specified sites of the oral cavity: Secondary | ICD-10-CM | POA: Diagnosis not present

## 2018-02-20 DIAGNOSIS — Z85850 Personal history of malignant neoplasm of thyroid: Secondary | ICD-10-CM | POA: Insufficient documentation

## 2018-02-20 DIAGNOSIS — D1030 Benign neoplasm of unspecified part of mouth: Secondary | ICD-10-CM | POA: Diagnosis not present

## 2018-02-20 HISTORY — DX: Hypothyroidism, unspecified: E03.9

## 2018-02-20 HISTORY — DX: Unspecified asthma, uncomplicated: J45.909

## 2018-02-20 HISTORY — DX: Pneumonia, unspecified organism: J18.9

## 2018-02-20 HISTORY — PX: MINOR EXCISION OF ORAL LESION: SHX6466

## 2018-02-20 HISTORY — DX: Personal history of urinary calculi: Z87.442

## 2018-02-20 SURGERY — MINOR EXCISION OF ORAL LESION
Anesthesia: LOCAL | Site: Mouth

## 2018-02-20 MED ORDER — AMOXICILLIN 875 MG PO TABS: 875.0000 mg | ORAL_TABLET | Freq: Two times a day (BID) | ORAL | 0 refills | Status: AC

## 2018-02-20 MED ORDER — LIDOCAINE-EPINEPHRINE 1 %-1:100000 IJ SOLN
INTRAMUSCULAR | Status: DC | PRN
  Administered 2018-02-20: 2 mL

## 2018-02-20 SURGICAL SUPPLY — 34 items
BLADE SURG 15 STRL LF DISP TIS (BLADE) ×2 IMPLANT
BLADE SURG 15 STRL SS (BLADE) ×1
CANISTER SUCT 1200ML W/VALVE (MISCELLANEOUS) ×3 IMPLANT
COVER MAYO STAND STRL (DRAPES) ×3 IMPLANT
ELECT COATED BLADE 2.86 ST (ELECTRODE) ×3 IMPLANT
ELECT NEEDLE BLADE 2-5/6 (NEEDLE) IMPLANT
ELECT REM PT RETURN 9FT ADLT (ELECTROSURGICAL) ×3
ELECT REM PT RETURN 9FT PED (ELECTROSURGICAL)
ELECTRODE REM PT RETRN 9FT PED (ELECTROSURGICAL) IMPLANT
ELECTRODE REM PT RTRN 9FT ADLT (ELECTROSURGICAL) ×2 IMPLANT
GLOVE BIO SURGEON STRL SZ7 (GLOVE) ×3 IMPLANT
GLOVE BIO SURGEON STRL SZ7.5 (GLOVE) ×6 IMPLANT
GLOVE BIOGEL PI IND STRL 6.5 (GLOVE) ×2 IMPLANT
GLOVE BIOGEL PI IND STRL 7.0 (GLOVE) ×2 IMPLANT
GLOVE BIOGEL PI IND STRL 7.5 (GLOVE) ×2 IMPLANT
GLOVE BIOGEL PI INDICATOR 6.5 (GLOVE) ×1
GLOVE BIOGEL PI INDICATOR 7.0 (GLOVE) ×1
GLOVE BIOGEL PI INDICATOR 7.5 (GLOVE) ×1
GOWN STRL REUS W/ TWL XL LVL3 (GOWN DISPOSABLE) ×6 IMPLANT
GOWN STRL REUS W/TWL XL LVL3 (GOWN DISPOSABLE) ×3
NEEDLE PRECISIONGLIDE 27X1.5 (NEEDLE) ×3 IMPLANT
PACK BASIN DAY SURGERY FS (CUSTOM PROCEDURE TRAY) IMPLANT
PENCIL BUTTON HOLSTER BLD 10FT (ELECTRODE) ×3 IMPLANT
PENCIL FOOT CONTROL (ELECTRODE) IMPLANT
SHEET MEDIUM DRAPE 40X70 STRL (DRAPES) ×3 IMPLANT
SPONGE NEURO XRAY DETECT 1X3 (DISPOSABLE) IMPLANT
SPONGE TONSIL 1.25 RF SGL STRG (GAUZE/BANDAGES/DRESSINGS) IMPLANT
SUCTION FRAZIER HANDLE 10FR (MISCELLANEOUS) ×1
SUCTION TUBE FRAZIER 10FR DISP (MISCELLANEOUS) ×2 IMPLANT
SUT CHROMIC 5 0 P 3 (SUTURE) IMPLANT
SYR CONTROL 10ML LL (SYRINGE) ×3 IMPLANT
TOWEL OR 17X24 6PK STRL BLUE (TOWEL DISPOSABLE) ×3 IMPLANT
TUBE CONNECTING 20X1/4 (TUBING) ×3 IMPLANT
YANKAUER SUCT BULB TIP NO VENT (SUCTIONS) ×3 IMPLANT

## 2018-02-20 NOTE — H&P (Signed)
Cc: Oral mass  HPI: The patient is a 67 year old male who presents today for his follow-up evaluation.  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  He also has a history of papillary thyroid microcarcinoma.  At this last visit, the patient elected to proceed with conservative observation.  According to the patient, he continues to do well.  However, he is interested in having the oral lesion surgically removed.  He denies any dysphagia or odynophagia.  He also denies any new neck mass.  No other ENT, GI, or respiratory issue noted since the last visit.   Exam General: Communicates without difficulty, obese, no acute distress. Head: Normocephalic, no evidence injury, no tenderness, facial buttresses intact without stepoff. Eyes: PERRL, EOMI. No scleral icterus, conjunctivae clear. Neuro: CN II exam reveals vision grossly intact.  No nystagmus at any point of gaze. Ears: Auricles well formed without lesions.  Ear canals are intact without mass or lesion.  No erythema or edema is appreciated.  The TMs are intact without fluid. Nose: External evaluation reveals normal support and skin without lesions.  Dorsum is intact.  Anterior rhinoscopy reveals healthy pink mucosa over anterior aspect of inferior turbinates and intact septum.  No purulence noted. Oral:  Oral cavity and oropharynx are intact, symmetric, without erythema or edema.  A nearly 1 cm exophytic soft tissue mass is noted on his left buccal mucosa.  Neck: Full range of motion without pain.  There is no significant lymphadenopathy.  No masses palpable. The neck incision is well healed. Parotid glands and submandibular glands equal bilaterally without mass.  Trachea is midline. Neuro:  CN 2-12 grossly intact. Gait normal.   Assessment 1.  The patient continues to have a 1 cm exophytic soft tissue lesion on his left buccal mucosa.   2.  History of papillary thyroid carcinoma.  He continues to be asymptomatic.    Plan  1.  The physical exam findings are reviewed with the patient.  2.  The patient would like to proceed with surgical excision of the oral lesion.  The procedure can be performed under local anesthesia in the minor surgical room.  3.  We will schedule the procedure as soon as possible.

## 2018-02-20 NOTE — Op Note (Signed)
DATE OF PROCEDURE:  02/20/2018                              OPERATIVE REPORT  SURGEON:  Leta Baptist, MD  PREOPERATIVE DIAGNOSES: 1. Left buccal mucosal mass  POSTOPERATIVE DIAGNOSES: 1. Left buccal mucosal masses x 2  PROCEDURE PERFORMED:  Excision of left buccal mucosa masses (CPT 435 [id number] [id number]) x2  ANESTHESIA:  Local anesthesia  COMPLICATIONS:  None.  ESTIMATED BLOOD LOSS:  Minimal.  INDICATION FOR PROCEDURE:   Jim Wilson is a 67 y.o. male with a history of a 1cm exophytic left buccal muccosa mass. The patient would like to have the mass removed. Intraoperatively, another smaller 39mm exophytic mass was noted on the left buccal mucosa, posterior to the first lesion. The decision was made to remove both lesions.The risks, benefits, alternatives, and details of the procedure were discussed with the patient.  Questions were invited and answered.  Informed consent was obtained.  DESCRIPTION:  The patient was taken to the operating room and placed supine on the operating table. 1% lidocaine with 1-100,000 epinephrine was infiltrated around the two left buccal mucosal lesions. An elliptical incision was made around the first lesion. The entire lesion, including the underlying mucosa, was excised. The same procedure was repeated for the second lesion. Both specimens were sent to the pathology department for permanent histologic identification. The surgical sites were copiously irrigated. The incisions were closed with interrupted 4-0 Vicryl sutures.  The patient tolerated the procedure well. The patient was transferred to the recovery room in good condition.  OPERATIVE FINDINGS:  2 exophytic left buccal mucosal lesions.  SPECIMEN: Left buccal mucosa lesions.  FOLLOWUP CARE:  The patient will be discharged home once he is awake and alert.  Jim Wilson 02/20/2018

## 2018-02-20 NOTE — Discharge Instructions (Signed)
The patient may resume all his previous activities and diet. He will follow-up in my office in one week.

## 2018-02-21 ENCOUNTER — Encounter (HOSPITAL_BASED_OUTPATIENT_CLINIC_OR_DEPARTMENT_OTHER): Payer: Self-pay | Admitting: Otolaryngology

## 2018-02-23 DIAGNOSIS — Z6841 Body Mass Index (BMI) 40.0 and over, adult: Secondary | ICD-10-CM | POA: Diagnosis not present

## 2018-02-23 DIAGNOSIS — J069 Acute upper respiratory infection, unspecified: Secondary | ICD-10-CM | POA: Diagnosis not present

## 2018-02-23 DIAGNOSIS — J441 Chronic obstructive pulmonary disease with (acute) exacerbation: Secondary | ICD-10-CM | POA: Diagnosis not present

## 2018-02-26 ENCOUNTER — Ambulatory Visit (INDEPENDENT_AMBULATORY_CARE_PROVIDER_SITE_OTHER): Payer: PPO | Admitting: Otolaryngology

## 2018-02-28 DIAGNOSIS — Z6841 Body Mass Index (BMI) 40.0 and over, adult: Secondary | ICD-10-CM | POA: Diagnosis not present

## 2018-02-28 DIAGNOSIS — H9202 Otalgia, left ear: Secondary | ICD-10-CM | POA: Diagnosis not present

## 2018-03-01 DIAGNOSIS — N400 Enlarged prostate without lower urinary tract symptoms: Secondary | ICD-10-CM | POA: Diagnosis not present

## 2018-03-01 DIAGNOSIS — Z6841 Body Mass Index (BMI) 40.0 and over, adult: Secondary | ICD-10-CM | POA: Diagnosis not present

## 2018-03-01 DIAGNOSIS — H9202 Otalgia, left ear: Secondary | ICD-10-CM | POA: Diagnosis not present

## 2018-03-01 DIAGNOSIS — E039 Hypothyroidism, unspecified: Secondary | ICD-10-CM | POA: Diagnosis not present

## 2018-03-01 DIAGNOSIS — B079 Viral wart, unspecified: Secondary | ICD-10-CM | POA: Diagnosis not present

## 2018-03-01 DIAGNOSIS — I482 Chronic atrial fibrillation: Secondary | ICD-10-CM | POA: Diagnosis not present

## 2018-03-05 DIAGNOSIS — I4891 Unspecified atrial fibrillation: Secondary | ICD-10-CM | POA: Diagnosis not present

## 2018-03-06 ENCOUNTER — Telehealth: Payer: Self-pay

## 2018-03-06 ENCOUNTER — Encounter: Payer: Self-pay | Admitting: Internal Medicine

## 2018-03-06 ENCOUNTER — Other Ambulatory Visit: Payer: Self-pay

## 2018-03-06 ENCOUNTER — Ambulatory Visit (INDEPENDENT_AMBULATORY_CARE_PROVIDER_SITE_OTHER): Payer: PPO | Admitting: Internal Medicine

## 2018-03-06 VITALS — BP 113/67 | HR 67 | Temp 97.0°F | Ht 73.0 in | Wt 307.6 lb

## 2018-03-06 DIAGNOSIS — K5909 Other constipation: Secondary | ICD-10-CM

## 2018-03-06 DIAGNOSIS — K7460 Unspecified cirrhosis of liver: Secondary | ICD-10-CM

## 2018-03-06 NOTE — Telephone Encounter (Signed)
Tried to call pt to schedule [place] RUQ, no answer, LMOVM.

## 2018-03-06 NOTE — Patient Instructions (Addendum)
Continue Linzess for constipation  Repeat liver ultrasound every 6 months  Will retrieve labs done by Dr. Nadara Mustard recently - may need more; we will see  Regular exercise and wt loss recommended  EGD to check for varicose veins within the next year  Office visit with [place] in 6 months  Colonoscopy 2024 [facility]

## 2018-03-06 NOTE — Progress Notes (Signed)
Primary Care Physician:  Rory Percy, MD Primary Gastroenterologist:  Dr. Gala Romney  Pre-Procedure History & Physical: HPI:  Jim Wilson is a 67 y.o. 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 History of colonic polyps. Failed attempt at repeat colonoscopy at [facility] They recommended he go back over there in 4 years for re-attempt with fluoroscopy.  No bowel issues now that he is taking Linzess. Bowels move very well. He is happy. Hasn't had any melena or rectal bleeding. No EGD for variceal screening. He has been vaccinated against hepatitis A and B. He is getting screening hepatic ultrasounds every 6 months. He states labs done by Dr. Lyman Speller recently,  but I do not have any reports for review at this time. 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  BMI stable at about 40. GERD well controlled on omeprazole 20 mg twice daily. No dysphagia.  Past Medical History:  Diagnosis Date  . A-fib ([place])   . Asthma   . Cancer ([place])    hx of skin cancers.   Mom & Dad passed away from lung cancer  . COPD (chronic obstructive pulmonary disease) ([place])   . 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 Dysrhythmia    A-FIB    . GERD (gastroesophageal reflux disease)   . History of hiatal hernia   . History of kidney stones   . Hypothyroidism   . Pneumonia    history of pneumonia 5 months ago  . Shortness of breath dyspnea   . Thyroid cancer ([place]) 2016  . Thyroid mass of unclear etiology    reason for doing this surgery  . Wolff-Parkinson-White (WPW) syndrome    had 2 different ablations.  One here @ [facility], the other at [facility]    Past Surgical History:  Procedure Laterality Date  . ABLATION     for WPW  . bladder surgery 4-5 yrs ago.    . CHOLECYSTECTOMY    . COLONOSCOPY  2005  . COLONOSCOPY N/A 02/23/2017   Dr. Gala Romney: Abnormal appearing anal skin, rectum and perianal mucosa demonstrated multiple nodularities similar appearance to lesions seen in the  upper GI tract. Colon prep poor/inadequate. Colon polyps versus nodules not manipulated. Plans for another attempt at colonoscopy in the near future.  . COLONOSCOPY N/A 05/16/2017   Procedure: COLONOSCOPY;  Surgeon: Daneil Dolin, MD;  [facility];  Service: Endoscopy;  Laterality: N/A;  10:30am  . ESOPHAGOGASTRODUODENOSCOPY N/A 02/23/2017   Dr. Gala Romney: Hard palate and hypopharynx revealed markedly nodular, deformed, abnormal appearing uvula, papillomatous appearing lesions around the retinoid cartilage. Esophagus has similar nodules completely lining the entire esophageal body. Question of short seg Barrett's (bx neg). Numerous nodules lining the gastric mucosa. 2-3 larger ones measuring up to 2 cm. Esophagus dilated. bx: gastrit  . EXCISION OF TONGUE LESION    . EYE SURGERY     bilateral cataracts  . HERNIA REPAIR     Inguinal hernia on left  . KNEE ARTHROSCOPY W/ MENISCAL REPAIR     on the right knee  . MALONEY DILATION N/A 02/23/2017   Procedure: [name] DILATION;  Surgeon: Daneil Dolin, MD;  [facility];  Service: Endoscopy;  Laterality: N/A;  . MINOR EXCISION OF ORAL LESION Left 02/20/2018   Procedure: EXCISION ORAL MASS;  Surgeon: Leta Baptist, MD;  Location: [place];  Service: ENT;  Laterality: Left;  . POLYPECTOMY  05/16/2017   Procedure: POLYPECTOMY;  Surgeon: Gala Romney,  Cristopher Estimable, MD;  [facility];  Service: Endoscopy;;  colon  . skin cancer removed from scalp    . THYROIDECTOMY Right 06/03/2015   Procedure: RIGHT HEMI THYROIDECTOMY;  Surgeon: Leta Baptist, MD;  Location: [place];  Service: ENT;  Laterality: Right;  . TONSILLECTOMY      Prior to Admission medications   Medication Sig Start Date End Date Taking? Authorizing Provider  albuterol (PROVENTIL) (2.5 MG/3ML) 0.083% nebulizer solution Take 2.5 mg by nebulization every 6 (six) hours as needed for wheezing or shortness of breath.   Yes [provider]  cetirizine (ZYRTEC) 10 MG  tablet Take 10 mg by mouth daily as needed for allergies.    Yes [provider]  diltiazem (CARDIZEM CD) 120 MG 24 hr capsule Take 120 mg by mouth daily.  03/28/15  Yes [provider]  fluticasone (FLONASE) 50 MCG/ACT nasal spray Place 2 sprays into both nostrils daily.   Yes [provider]  Fluticasone-Salmeterol (ADVAIR) 250-50 MCG/DOSE AEPB Inhale 1 puff into the lungs 2 (two) times daily.    Yes [provider]  furosemide (LASIX) 40 MG tablet Take 40 mg by mouth 2 (two) times daily.  04/08/15  Yes [provider]  gabapentin (NEURONTIN) 300 MG capsule Take 300 mg by mouth 3 (three) times daily.   Yes [provider]  ibuprofen (ADVIL,MOTRIN) 200 MG tablet Take 400 mg by mouth every 8 (eight) hours as needed (for pain/headaches.).   Yes [provider]  ketoconazole (NIZORAL) 2 % cream Apply 1 application topically 3 (three) times daily as needed (applied to face for dry/irritated skin).  03/27/15  Yes [provider]  levothyroxine (SYNTHROID, LEVOTHROID) 75 MCG tablet Take 75 mcg by mouth daily before breakfast.   Yes [provider]  linaclotide (LINZESS) 145 MCG CAPS capsule Take 1 capsule (145 mcg total) by mouth daily before breakfast. 11/15/17  Yes Mahala Menghini, PA-C  omeprazole (PRILOSEC) 20 MG capsule Take 20 mg by mouth 2 (two) times daily.  04/03/15  Yes [provider]  potassium chloride (K-DUR) 10 MEQ tablet Take 10 mEq by mouth daily.  04/08/15  Yes [provider]  simvastatin (ZOCOR) 20 MG tablet Take 20 mg by mouth at bedtime.  03/16/15  Yes [provider]  tamsulosin (FLOMAX) 0.4 MG CAPS capsule Take 0.4 mg by mouth 2 (two) times daily.  03/07/15  Yes [provider]  warfarin (COUMADIN) 3 MG tablet Take 3 mg by mouth daily. 01/21/17  Yes [provider]    Allergies as of 03/06/2018  . (No Known Allergies)    Family History  Problem Relation Age of  Onset  . Lung cancer Mother   . Cancer Mother   . Lung cancer Father   . Heart disease Father   . Liver disease Neg Hx   . Colon cancer Neg Hx     Social History   Socioeconomic History  . Marital status: Single    Spouse name: Not on file  . Number of children: Not on file  . Years of education: Not on file  . Highest education level: Not on file  Occupational History  . Not on file  Social Needs  . Financial resource strain: Not on file  . Food insecurity:    Worry: Not on file    Inability: Not on file  . Transportation needs:    Medical: Not on file    Non-medical: Not on file  Tobacco Use  . Smoking status: Never Smoker  . Smokeless tobacco: Never Used  Substance and Sexual Activity  . Alcohol use: No  . Drug use: No  . Sexual activity: Not on file  Lifestyle  . Physical activity:    Days per week: Not on file    Minutes per session: Not on file  . Stress: Not on file  Relationships  . Social connections:    Talks on phone: Not on file    Gets together: Not on file    Attends religious service: Not on file    Active member of club or organization: Not on file    Attends meetings of clubs or organizations: Not on file    Relationship status: Not on file  . Intimate partner violence:    Fear of current or ex partner: Not on file    Emotionally abused: Not on file    Physically abused: Not on file    Forced sexual activity: Not on file  Other Topics Concern  . Not on file  Social History Narrative  . Not on file    Review of Systems: See HPI, otherwise negative ROS  Physical Exam: BP 113/67   Pulse 67   Temp (!) 97 F (36.1 C) (Oral)   Ht 6\' 1"  (1.854 m)   Wt (!) 307 lb 9.6 oz (139.5 kg)   BMI 40.58 kg/m  General:   Alert,  , pleasant and cooperative in NAD Neck:  Supple; no masses or thyromegaly. No significant cervical adenopathy. Lungs:  Clear throughout to auscultation.   No wheezes, crackles, or rhonchi. No acute distress. Heart:   Regular rate and rhythm; no murmurs, clicks, rubs,  or gallops. Abdomen: Obese. Soft and nontender no obvious mass or organomegaly. Abdominal girth limits physical examination. Pulses:  Normal pulses noted. Extremities:  Trace edema.  Impression:  Pleasant morbidly obese 67 year old gentleman with Nash/cirrhosis. Currently, doing very well. He is enrolled in cirrhosis care. Needs a screening EGD within the next year. He reports having lab work to Dr. Lyman Speller office. I would like to get that data for review. History colonic adenoma with failed attempt at colonoscopy here and over at [facility] Surveillance colonoscopy planned over there in 2024.  History of chronic constipation managed very nicely on Linzess at this time.  Recommendations:  Continue Linzess for constipation  Repeat liver ultrasound every 6 months  Will retrieve labs done by Dr. Nadara Mustard recently - may or may not need more blood drawn ; we will see  Regular exercise and wt loss recommended  Screening EGD to within the next year  Office visit with [place] in 6 months  Colonoscopy 2024 [facility]   Notice: This dictation was prepared with Dragon dictation along with smaller phrase technology. Any transcriptional errors that result from this process are unintentional and may not be corrected upon review.

## 2018-03-06 NOTE — Telephone Encounter (Signed)
Pt called office. Ok to schedule liver US.  US abdomen RUQ scheduled for 03/09/18 at 8:30am, arrive at 8:15am. NPO after midnight prior to test. Called pt back and informed of appt. Letter mailed.

## 2018-03-09 ENCOUNTER — Ambulatory Visit (HOSPITAL_COMMUNITY)
Admission: RE | Admit: 2018-03-09 | Discharge: 2018-03-09 | Disposition: A | Discharge status: Home / Self Care | Payer: PPO | Source: Ambulatory Visit | Attending: Internal Medicine | Admitting: Internal Medicine

## 2018-03-09 DIAGNOSIS — K7460 Unspecified cirrhosis of liver: Secondary | ICD-10-CM | POA: Insufficient documentation

## 2018-03-09 DIAGNOSIS — Z9049 Acquired absence of other specified parts of digestive tract: Secondary | ICD-10-CM | POA: Insufficient documentation

## 2018-03-19 DIAGNOSIS — M25511 Pain in right shoulder: Secondary | ICD-10-CM | POA: Diagnosis not present

## 2018-03-19 DIAGNOSIS — Z6841 Body Mass Index (BMI) 40.0 and over, adult: Secondary | ICD-10-CM | POA: Diagnosis not present

## 2018-03-19 DIAGNOSIS — I482 Chronic atrial fibrillation: Secondary | ICD-10-CM | POA: Diagnosis not present

## 2018-03-27 ENCOUNTER — Telehealth: Payer: Self-pay

## 2018-03-27 NOTE — Telephone Encounter (Signed)
Pt's Linzess 145 mg samples came here . ( 3 bottles of 30 tablets each). [facility] I called and informed Lynn Ito in [facility] and she said to have the pt come by our office to pick up and she will notify the appropriate person to check in to it for next delivery.  PT is aware to come by and pick up samples.

## 2018-03-29 DIAGNOSIS — E039 Hypothyroidism, unspecified: Secondary | ICD-10-CM | POA: Diagnosis not present

## 2018-03-29 DIAGNOSIS — I10 Essential (primary) hypertension: Secondary | ICD-10-CM | POA: Diagnosis not present

## 2018-03-30 DIAGNOSIS — J3089 Other allergic rhinitis: Secondary | ICD-10-CM | POA: Diagnosis not present

## 2018-03-30 DIAGNOSIS — Z6841 Body Mass Index (BMI) 40.0 and over, adult: Secondary | ICD-10-CM | POA: Diagnosis not present

## 2018-04-02 DIAGNOSIS — Z7901 Long term (current) use of anticoagulants: Secondary | ICD-10-CM | POA: Diagnosis not present

## 2018-04-04 DIAGNOSIS — B078 Other viral warts: Secondary | ICD-10-CM | POA: Diagnosis not present

## 2018-04-04 DIAGNOSIS — M25511 Pain in right shoulder: Secondary | ICD-10-CM | POA: Diagnosis not present

## 2018-04-04 DIAGNOSIS — Z6841 Body Mass Index (BMI) 40.0 and over, adult: Secondary | ICD-10-CM | POA: Diagnosis not present

## 2018-04-04 DIAGNOSIS — I482 Chronic atrial fibrillation: Secondary | ICD-10-CM | POA: Diagnosis not present

## 2018-04-16 DIAGNOSIS — I482 Chronic atrial fibrillation: Secondary | ICD-10-CM | POA: Diagnosis not present

## 2018-05-08 DIAGNOSIS — B351 Tinea unguium: Secondary | ICD-10-CM | POA: Diagnosis not present

## 2018-05-08 DIAGNOSIS — M79676 Pain in unspecified toe(s): Secondary | ICD-10-CM | POA: Diagnosis not present

## 2018-05-11 DIAGNOSIS — B079 Viral wart, unspecified: Secondary | ICD-10-CM | POA: Diagnosis not present

## 2018-05-14 DIAGNOSIS — I482 Chronic atrial fibrillation: Secondary | ICD-10-CM | POA: Diagnosis not present

## 2018-05-14 DIAGNOSIS — I82409 Acute embolism and thrombosis of unspecified deep veins of unspecified lower extremity: Secondary | ICD-10-CM | POA: Diagnosis not present

## 2018-06-02 DIAGNOSIS — Z6839 Body mass index (BMI) 39.0-39.9, adult: Secondary | ICD-10-CM | POA: Diagnosis not present

## 2018-06-02 DIAGNOSIS — B353 Tinea pedis: Secondary | ICD-10-CM | POA: Diagnosis not present

## 2018-06-02 DIAGNOSIS — B070 Plantar wart: Secondary | ICD-10-CM | POA: Diagnosis not present

## 2018-06-02 DIAGNOSIS — L603 Nail dystrophy: Secondary | ICD-10-CM | POA: Diagnosis not present

## 2018-06-08 DIAGNOSIS — M545 Low back pain: Secondary | ICD-10-CM | POA: Diagnosis not present

## 2018-06-08 DIAGNOSIS — Z6841 Body Mass Index (BMI) 40.0 and over, adult: Secondary | ICD-10-CM | POA: Diagnosis not present

## 2018-06-13 DIAGNOSIS — I82409 Acute embolism and thrombosis of unspecified deep veins of unspecified lower extremity: Secondary | ICD-10-CM | POA: Diagnosis not present

## 2018-06-13 DIAGNOSIS — R300 Dysuria: Secondary | ICD-10-CM | POA: Diagnosis not present

## 2018-06-13 DIAGNOSIS — Z6841 Body Mass Index (BMI) 40.0 and over, adult: Secondary | ICD-10-CM | POA: Diagnosis not present

## 2018-06-20 DIAGNOSIS — I482 Chronic atrial fibrillation: Secondary | ICD-10-CM | POA: Diagnosis not present

## 2018-06-23 DIAGNOSIS — L84 Corns and callosities: Secondary | ICD-10-CM | POA: Diagnosis not present

## 2018-06-23 DIAGNOSIS — Z6841 Body Mass Index (BMI) 40.0 and over, adult: Secondary | ICD-10-CM | POA: Diagnosis not present

## 2018-06-26 DIAGNOSIS — L97511 Non-pressure chronic ulcer of other part of right foot limited to breakdown of skin: Secondary | ICD-10-CM | POA: Diagnosis not present

## 2018-06-26 DIAGNOSIS — G609 Hereditary and idiopathic neuropathy, unspecified: Secondary | ICD-10-CM | POA: Diagnosis not present

## 2018-06-28 DIAGNOSIS — I10 Essential (primary) hypertension: Secondary | ICD-10-CM | POA: Diagnosis not present

## 2018-06-28 DIAGNOSIS — E039 Hypothyroidism, unspecified: Secondary | ICD-10-CM | POA: Diagnosis not present

## 2018-06-28 DIAGNOSIS — E782 Mixed hyperlipidemia: Secondary | ICD-10-CM | POA: Diagnosis not present

## 2018-07-03 DIAGNOSIS — E039 Hypothyroidism, unspecified: Secondary | ICD-10-CM | POA: Diagnosis not present

## 2018-07-03 DIAGNOSIS — I482 Chronic atrial fibrillation: Secondary | ICD-10-CM | POA: Diagnosis not present

## 2018-07-03 DIAGNOSIS — L570 Actinic keratosis: Secondary | ICD-10-CM | POA: Diagnosis not present

## 2018-07-03 DIAGNOSIS — R634 Abnormal weight loss: Secondary | ICD-10-CM | POA: Diagnosis not present

## 2018-07-03 DIAGNOSIS — E782 Mixed hyperlipidemia: Secondary | ICD-10-CM | POA: Diagnosis not present

## 2018-07-03 DIAGNOSIS — I10 Essential (primary) hypertension: Secondary | ICD-10-CM | POA: Diagnosis not present

## 2018-07-03 DIAGNOSIS — Z6839 Body mass index (BMI) 39.0-39.9, adult: Secondary | ICD-10-CM | POA: Diagnosis not present

## 2018-07-10 DIAGNOSIS — L97511 Non-pressure chronic ulcer of other part of right foot limited to breakdown of skin: Secondary | ICD-10-CM | POA: Diagnosis not present

## 2018-07-12 DIAGNOSIS — M1712 Unilateral primary osteoarthritis, left knee: Secondary | ICD-10-CM | POA: Diagnosis not present

## 2018-07-12 DIAGNOSIS — M1711 Unilateral primary osteoarthritis, right knee: Secondary | ICD-10-CM | POA: Diagnosis not present

## 2018-07-17 DIAGNOSIS — Z6839 Body mass index (BMI) 39.0-39.9, adult: Secondary | ICD-10-CM | POA: Diagnosis not present

## 2018-07-17 DIAGNOSIS — E782 Mixed hyperlipidemia: Secondary | ICD-10-CM | POA: Diagnosis not present

## 2018-07-17 DIAGNOSIS — I10 Essential (primary) hypertension: Secondary | ICD-10-CM | POA: Diagnosis not present

## 2018-07-17 DIAGNOSIS — I482 Chronic atrial fibrillation: Secondary | ICD-10-CM | POA: Diagnosis not present

## 2018-07-17 DIAGNOSIS — E039 Hypothyroidism, unspecified: Secondary | ICD-10-CM | POA: Diagnosis not present

## 2018-07-20 DIAGNOSIS — Z6839 Body mass index (BMI) 39.0-39.9, adult: Secondary | ICD-10-CM | POA: Diagnosis not present

## 2018-07-20 DIAGNOSIS — I482 Chronic atrial fibrillation: Secondary | ICD-10-CM | POA: Diagnosis not present

## 2018-07-20 DIAGNOSIS — S30810S Abrasion of lower back and pelvis, sequela: Secondary | ICD-10-CM | POA: Diagnosis not present

## 2018-08-07 DIAGNOSIS — B351 Tinea unguium: Secondary | ICD-10-CM | POA: Diagnosis not present

## 2018-08-07 DIAGNOSIS — L84 Corns and callosities: Secondary | ICD-10-CM | POA: Diagnosis not present

## 2018-08-07 DIAGNOSIS — M79676 Pain in unspecified toe(s): Secondary | ICD-10-CM | POA: Diagnosis not present

## 2018-08-07 DIAGNOSIS — G609 Hereditary and idiopathic neuropathy, unspecified: Secondary | ICD-10-CM | POA: Diagnosis not present

## 2018-08-20 ENCOUNTER — Telehealth: Payer: Self-pay

## 2018-08-20 NOTE — Telephone Encounter (Signed)
Paper work form [employer] on desk for [employer], [place] to sign.

## 2018-08-21 DIAGNOSIS — R5383 Other fatigue: Secondary | ICD-10-CM | POA: Diagnosis not present

## 2018-08-21 DIAGNOSIS — J449 Chronic obstructive pulmonary disease, unspecified: Secondary | ICD-10-CM | POA: Diagnosis not present

## 2018-08-21 DIAGNOSIS — I481 Persistent atrial fibrillation: Secondary | ICD-10-CM | POA: Diagnosis not present

## 2018-08-21 DIAGNOSIS — I82811 Embolism and thrombosis of superficial veins of right lower extremities: Secondary | ICD-10-CM | POA: Diagnosis not present

## 2018-08-21 DIAGNOSIS — I5032 Chronic diastolic (congestive) heart failure: Secondary | ICD-10-CM | POA: Diagnosis not present

## 2018-08-21 DIAGNOSIS — Z86711 Personal history of pulmonary embolism: Secondary | ICD-10-CM | POA: Diagnosis not present

## 2018-08-21 DIAGNOSIS — I6200 Nontraumatic subdural hemorrhage, unspecified: Secondary | ICD-10-CM | POA: Diagnosis not present

## 2018-08-21 DIAGNOSIS — K741 Hepatic sclerosis: Secondary | ICD-10-CM | POA: Diagnosis not present

## 2018-08-21 DIAGNOSIS — G935 Compression of brain: Secondary | ICD-10-CM | POA: Diagnosis not present

## 2018-08-21 DIAGNOSIS — K7581 Nonalcoholic steatohepatitis (NASH): Secondary | ICD-10-CM | POA: Diagnosis not present

## 2018-08-21 DIAGNOSIS — I6201 Nontraumatic acute subdural hemorrhage: Secondary | ICD-10-CM | POA: Diagnosis not present

## 2018-08-21 DIAGNOSIS — I824Y1 Acute embolism and thrombosis of unspecified deep veins of right proximal lower extremity: Secondary | ICD-10-CM | POA: Diagnosis not present

## 2018-08-21 DIAGNOSIS — R6521 Severe sepsis with septic shock: Secondary | ICD-10-CM | POA: Diagnosis not present

## 2018-08-21 DIAGNOSIS — G9340 Encephalopathy, unspecified: Secondary | ICD-10-CM | POA: Diagnosis not present

## 2018-08-21 DIAGNOSIS — R633 Feeding difficulties: Secondary | ICD-10-CM | POA: Diagnosis not present

## 2018-08-21 DIAGNOSIS — R509 Fever, unspecified: Secondary | ICD-10-CM | POA: Diagnosis not present

## 2018-08-21 DIAGNOSIS — A419 Sepsis, unspecified organism: Secondary | ICD-10-CM | POA: Diagnosis not present

## 2018-08-21 DIAGNOSIS — Z66 Do not resuscitate: Secondary | ICD-10-CM | POA: Diagnosis not present

## 2018-08-21 DIAGNOSIS — I82402 Acute embolism and thrombosis of unspecified deep veins of left lower extremity: Secondary | ICD-10-CM | POA: Diagnosis not present

## 2018-08-21 DIAGNOSIS — I959 Hypotension, unspecified: Secondary | ICD-10-CM | POA: Diagnosis not present

## 2018-08-21 DIAGNOSIS — I4819 Other persistent atrial fibrillation: Secondary | ICD-10-CM | POA: Diagnosis not present

## 2018-08-21 DIAGNOSIS — Z8679 Personal history of other diseases of the circulatory system: Secondary | ICD-10-CM | POA: Diagnosis not present

## 2018-08-21 DIAGNOSIS — Z79899 Other long term (current) drug therapy: Secondary | ICD-10-CM | POA: Diagnosis not present

## 2018-08-21 DIAGNOSIS — E279 Disorder of adrenal gland, unspecified: Secondary | ICD-10-CM | POA: Diagnosis not present

## 2018-08-21 DIAGNOSIS — J189 Pneumonia, unspecified organism: Secondary | ICD-10-CM | POA: Diagnosis not present

## 2018-08-21 DIAGNOSIS — K589 Irritable bowel syndrome without diarrhea: Secondary | ICD-10-CM | POA: Diagnosis not present

## 2018-08-21 DIAGNOSIS — R140 Abdominal distension (gaseous): Secondary | ICD-10-CM | POA: Diagnosis not present

## 2018-08-21 DIAGNOSIS — R42 Dizziness and giddiness: Secondary | ICD-10-CM | POA: Diagnosis not present

## 2018-08-21 DIAGNOSIS — J42 Unspecified chronic bronchitis: Secondary | ICD-10-CM | POA: Diagnosis not present

## 2018-08-21 DIAGNOSIS — C3412 Malignant neoplasm of upper lobe, left bronchus or lung: Secondary | ICD-10-CM | POA: Diagnosis not present

## 2018-08-21 DIAGNOSIS — R471 Dysarthria and anarthria: Secondary | ICD-10-CM | POA: Diagnosis not present

## 2018-08-21 DIAGNOSIS — I482 Chronic atrial fibrillation, unspecified: Secondary | ICD-10-CM | POA: Diagnosis not present

## 2018-08-21 DIAGNOSIS — S065X9A Traumatic subdural hemorrhage with loss of consciousness of unspecified duration, initial encounter: Secondary | ICD-10-CM | POA: Diagnosis not present

## 2018-08-21 DIAGNOSIS — R001 Bradycardia, unspecified: Secondary | ICD-10-CM | POA: Diagnosis not present

## 2018-08-21 DIAGNOSIS — R7989 Other specified abnormal findings of blood chemistry: Secondary | ICD-10-CM | POA: Diagnosis not present

## 2018-08-21 DIAGNOSIS — I517 Cardiomegaly: Secondary | ICD-10-CM | POA: Diagnosis not present

## 2018-08-21 DIAGNOSIS — D696 Thrombocytopenia, unspecified: Secondary | ICD-10-CM | POA: Diagnosis not present

## 2018-08-21 DIAGNOSIS — J690 Pneumonitis due to inhalation of food and vomit: Secondary | ICD-10-CM | POA: Diagnosis not present

## 2018-08-21 DIAGNOSIS — R1310 Dysphagia, unspecified: Secondary | ICD-10-CM | POA: Diagnosis not present

## 2018-08-21 DIAGNOSIS — E876 Hypokalemia: Secondary | ICD-10-CM | POA: Diagnosis not present

## 2018-08-21 DIAGNOSIS — Z7901 Long term (current) use of anticoagulants: Secondary | ICD-10-CM | POA: Diagnosis not present

## 2018-08-21 DIAGNOSIS — I10 Essential (primary) hypertension: Secondary | ICD-10-CM | POA: Diagnosis not present

## 2018-08-21 DIAGNOSIS — D72829 Elevated white blood cell count, unspecified: Secondary | ICD-10-CM | POA: Diagnosis not present

## 2018-08-21 DIAGNOSIS — G8384 Todd's paralysis (postepileptic): Secondary | ICD-10-CM | POA: Diagnosis not present

## 2018-08-21 DIAGNOSIS — K210 Gastro-esophageal reflux disease with esophagitis: Secondary | ICD-10-CM | POA: Diagnosis not present

## 2018-08-21 DIAGNOSIS — L89616 Pressure-induced deep tissue damage of right heel: Secondary | ICD-10-CM | POA: Diagnosis not present

## 2018-08-21 DIAGNOSIS — R918 Other nonspecific abnormal finding of lung field: Secondary | ICD-10-CM | POA: Diagnosis not present

## 2018-08-21 DIAGNOSIS — R4182 Altered mental status, unspecified: Secondary | ICD-10-CM | POA: Diagnosis not present

## 2018-08-21 DIAGNOSIS — J80 Acute respiratory distress syndrome: Secondary | ICD-10-CM | POA: Diagnosis not present

## 2018-08-21 DIAGNOSIS — E872 Acidosis: Secondary | ICD-10-CM | POA: Diagnosis not present

## 2018-08-21 DIAGNOSIS — K7460 Unspecified cirrhosis of liver: Secondary | ICD-10-CM | POA: Diagnosis not present

## 2018-08-21 DIAGNOSIS — I2129 ST elevation (STEMI) myocardial infarction involving other sites: Secondary | ICD-10-CM | POA: Diagnosis not present

## 2018-08-21 DIAGNOSIS — Z515 Encounter for palliative care: Secondary | ICD-10-CM | POA: Diagnosis not present

## 2018-08-21 DIAGNOSIS — E039 Hypothyroidism, unspecified: Secondary | ICD-10-CM | POA: Diagnosis not present

## 2018-08-21 DIAGNOSIS — K219 Gastro-esophageal reflux disease without esophagitis: Secondary | ICD-10-CM | POA: Diagnosis not present

## 2018-08-21 DIAGNOSIS — J9601 Acute respiratory failure with hypoxia: Secondary | ICD-10-CM | POA: Diagnosis not present

## 2018-08-21 DIAGNOSIS — E8809 Other disorders of plasma-protein metabolism, not elsewhere classified: Secondary | ICD-10-CM | POA: Diagnosis not present

## 2018-08-21 DIAGNOSIS — Z86718 Personal history of other venous thrombosis and embolism: Secondary | ICD-10-CM | POA: Diagnosis not present

## 2018-08-21 DIAGNOSIS — I456 Pre-excitation syndrome: Secondary | ICD-10-CM | POA: Diagnosis not present

## 2018-08-21 DIAGNOSIS — N400 Enlarged prostate without lower urinary tract symptoms: Secondary | ICD-10-CM | POA: Diagnosis not present

## 2018-08-21 DIAGNOSIS — I4892 Unspecified atrial flutter: Secondary | ICD-10-CM | POA: Diagnosis not present

## 2018-08-21 DIAGNOSIS — E878 Other disorders of electrolyte and fluid balance, not elsewhere classified: Secondary | ICD-10-CM | POA: Diagnosis not present

## 2018-08-21 DIAGNOSIS — I82502 Chronic embolism and thrombosis of unspecified deep veins of left lower extremity: Secondary | ICD-10-CM | POA: Diagnosis not present

## 2018-08-21 DIAGNOSIS — D320 Benign neoplasm of cerebral meninges: Secondary | ICD-10-CM | POA: Diagnosis not present

## 2018-08-21 DIAGNOSIS — Z4659 Encounter for fitting and adjustment of other gastrointestinal appliance and device: Secondary | ICD-10-CM | POA: Diagnosis not present

## 2018-08-21 DIAGNOSIS — Z48811 Encounter for surgical aftercare following surgery on the nervous system: Secondary | ICD-10-CM | POA: Diagnosis not present

## 2018-08-21 DIAGNOSIS — R1319 Other dysphagia: Secondary | ICD-10-CM | POA: Diagnosis not present

## 2018-08-21 DIAGNOSIS — L89152 Pressure ulcer of sacral region, stage 2: Secondary | ICD-10-CM | POA: Diagnosis not present

## 2018-08-21 DIAGNOSIS — L89319 Pressure ulcer of right buttock, unspecified stage: Secondary | ICD-10-CM | POA: Diagnosis not present

## 2018-08-21 DIAGNOSIS — Z9911 Dependence on respirator [ventilator] status: Secondary | ICD-10-CM | POA: Diagnosis not present

## 2018-08-21 DIAGNOSIS — I743 Embolism and thrombosis of arteries of the lower extremities: Secondary | ICD-10-CM | POA: Diagnosis not present

## 2018-08-21 DIAGNOSIS — Z7982 Long term (current) use of aspirin: Secondary | ICD-10-CM | POA: Diagnosis not present

## 2018-08-21 DIAGNOSIS — Z9889 Other specified postprocedural states: Secondary | ICD-10-CM | POA: Diagnosis not present

## 2018-08-21 DIAGNOSIS — R4701 Aphasia: Secondary | ICD-10-CM | POA: Diagnosis not present

## 2018-08-21 DIAGNOSIS — K581 Irritable bowel syndrome with constipation: Secondary | ICD-10-CM | POA: Diagnosis not present

## 2018-08-21 DIAGNOSIS — Z4682 Encounter for fitting and adjustment of non-vascular catheter: Secondary | ICD-10-CM | POA: Diagnosis not present

## 2018-08-21 DIAGNOSIS — I2699 Other pulmonary embolism without acute cor pulmonale: Secondary | ICD-10-CM | POA: Diagnosis not present

## 2018-08-21 DIAGNOSIS — J90 Pleural effusion, not elsewhere classified: Secondary | ICD-10-CM | POA: Diagnosis not present

## 2018-08-21 DIAGNOSIS — Z9981 Dependence on supplemental oxygen: Secondary | ICD-10-CM | POA: Diagnosis not present

## 2018-08-21 DIAGNOSIS — R1311 Dysphagia, oral phase: Secondary | ICD-10-CM | POA: Diagnosis not present

## 2018-08-21 DIAGNOSIS — G9349 Other encephalopathy: Secondary | ICD-10-CM | POA: Diagnosis not present

## 2018-08-21 DIAGNOSIS — D329 Benign neoplasm of meninges, unspecified: Secondary | ICD-10-CM | POA: Diagnosis not present

## 2018-08-21 DIAGNOSIS — R51 Headache: Secondary | ICD-10-CM | POA: Diagnosis not present

## 2018-08-21 DIAGNOSIS — Y95 Nosocomial condition: Secondary | ICD-10-CM | POA: Diagnosis not present

## 2018-08-21 DIAGNOSIS — E870 Hyperosmolality and hypernatremia: Secondary | ICD-10-CM | POA: Diagnosis not present

## 2018-08-21 DIAGNOSIS — Z6839 Body mass index (BMI) 39.0-39.9, adult: Secondary | ICD-10-CM | POA: Diagnosis not present

## 2018-08-21 DIAGNOSIS — I4891 Unspecified atrial fibrillation: Secondary | ICD-10-CM | POA: Diagnosis not present

## 2018-08-21 DIAGNOSIS — E440 Moderate protein-calorie malnutrition: Secondary | ICD-10-CM | POA: Diagnosis not present

## 2018-08-21 DIAGNOSIS — N179 Acute kidney failure, unspecified: Secondary | ICD-10-CM | POA: Diagnosis not present

## 2018-08-21 DIAGNOSIS — R911 Solitary pulmonary nodule: Secondary | ICD-10-CM | POA: Diagnosis not present

## 2018-08-21 DIAGNOSIS — J810 Acute pulmonary edema: Secondary | ICD-10-CM | POA: Diagnosis not present

## 2018-08-21 DIAGNOSIS — R791 Abnormal coagulation profile: Secondary | ICD-10-CM | POA: Diagnosis not present

## 2018-08-21 DIAGNOSIS — K7469 Other cirrhosis of liver: Secondary | ICD-10-CM | POA: Diagnosis not present

## 2018-08-21 DIAGNOSIS — E7800 Pure hypercholesterolemia, unspecified: Secondary | ICD-10-CM | POA: Diagnosis not present

## 2018-08-21 DIAGNOSIS — R9431 Abnormal electrocardiogram [ECG] [EKG]: Secondary | ICD-10-CM | POA: Diagnosis not present

## 2018-08-21 DIAGNOSIS — I619 Nontraumatic intracerebral hemorrhage, unspecified: Secondary | ICD-10-CM | POA: Diagnosis not present

## 2018-08-21 DIAGNOSIS — I2693 Single subsegmental pulmonary embolism without acute cor pulmonale: Secondary | ICD-10-CM | POA: Diagnosis not present

## 2018-08-21 NOTE — Telephone Encounter (Signed)
Forms completed

## 2018-08-21 NOTE — Telephone Encounter (Signed)
Copy made for scanning and original mailed back to [facility].

## 2018-09-05 ENCOUNTER — Telehealth: Payer: Self-pay | Admitting: Gastroenterology

## 2018-09-05 ENCOUNTER — Encounter: Payer: Self-pay | Admitting: Gastroenterology

## 2018-09-05 ENCOUNTER — Ambulatory Visit: Payer: PPO | Admitting: Gastroenterology

## 2018-09-05 NOTE — Telephone Encounter (Signed)
Patient was a no show and letter sent  °

## 2018-10-03 DIAGNOSIS — I482 Chronic atrial fibrillation, unspecified: Secondary | ICD-10-CM | POA: Diagnosis not present

## 2018-10-03 DIAGNOSIS — J45909 Unspecified asthma, uncomplicated: Secondary | ICD-10-CM | POA: Diagnosis not present

## 2018-10-03 DIAGNOSIS — I10 Essential (primary) hypertension: Secondary | ICD-10-CM | POA: Diagnosis not present

## 2018-10-03 DIAGNOSIS — K2990 Gastroduodenitis, unspecified, without bleeding: Secondary | ICD-10-CM | POA: Diagnosis not present

## 2018-10-03 DIAGNOSIS — E039 Hypothyroidism, unspecified: Secondary | ICD-10-CM | POA: Diagnosis not present

## 2018-10-03 DIAGNOSIS — E782 Mixed hyperlipidemia: Secondary | ICD-10-CM | POA: Diagnosis not present

## 2018-10-05 DEATH — deceased

## 2019-09-20 IMAGING — US US ABDOMEN LIMITED
1 series · 14 of 25 positions shown · non-contrast
Comparison: 02/05/2017 CT with contrast

CLINICAL DATA: Cirrhosis, nonalcoholic steatohepatitis, remote
cholecystectomy

EXAM:
ULTRASOUND ABDOMEN LIMITED RIGHT UPPER QUADRANT

[Series 1: us abdomen limited · 0.30mm/px · 14 of 59 slices shown]
[im 1/59]
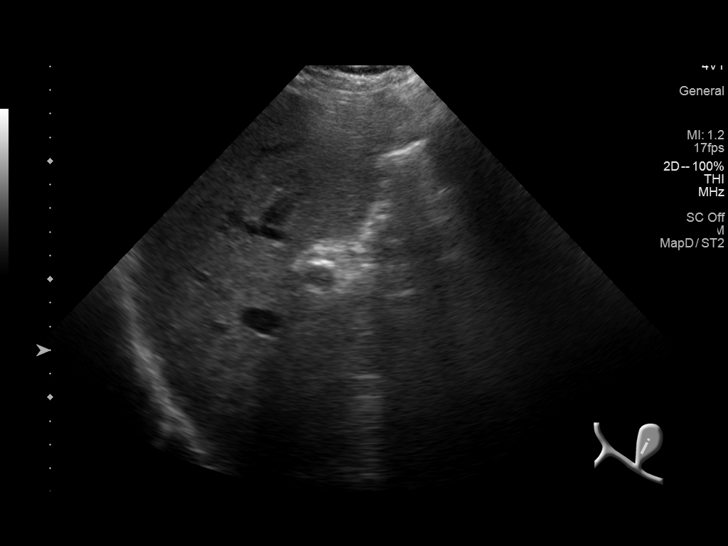
[im 5/59]
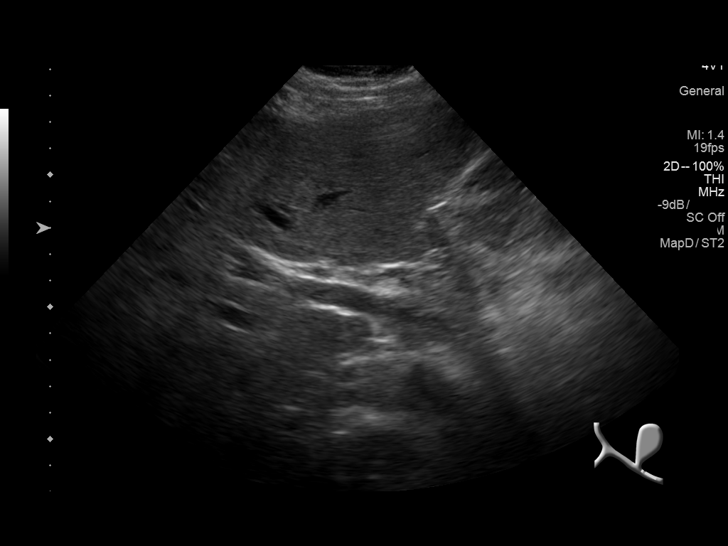
[im 10/59]
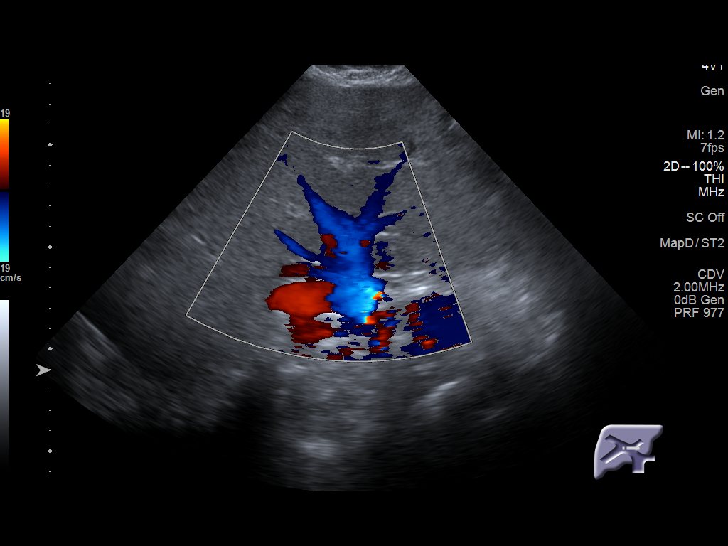
[im 15/59]
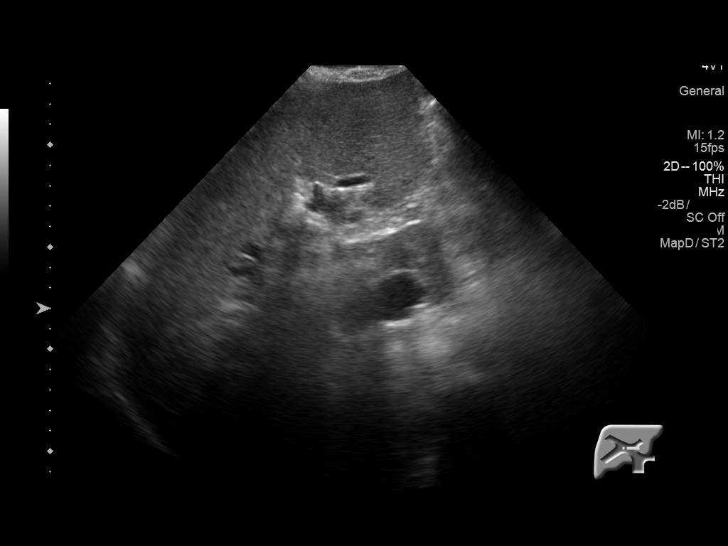
[im 20/59]
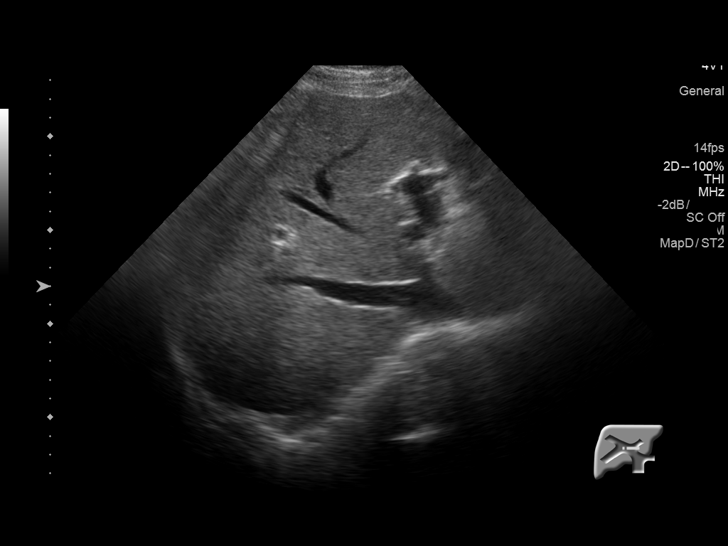
[im 22/59]
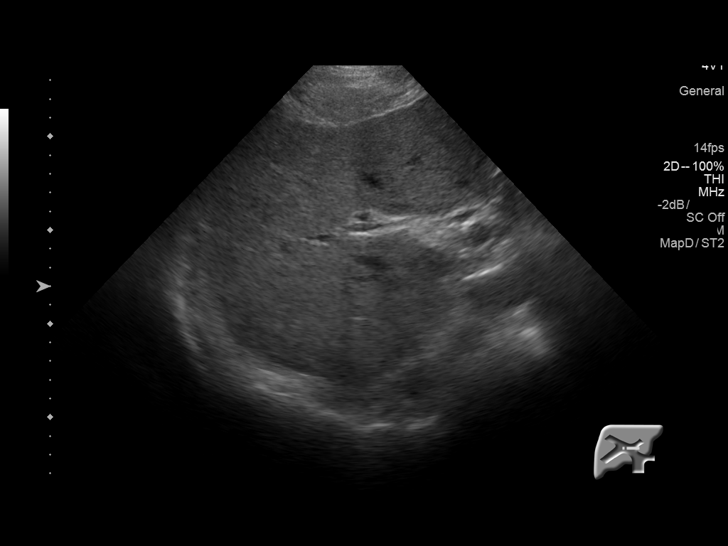
[im 27/59]
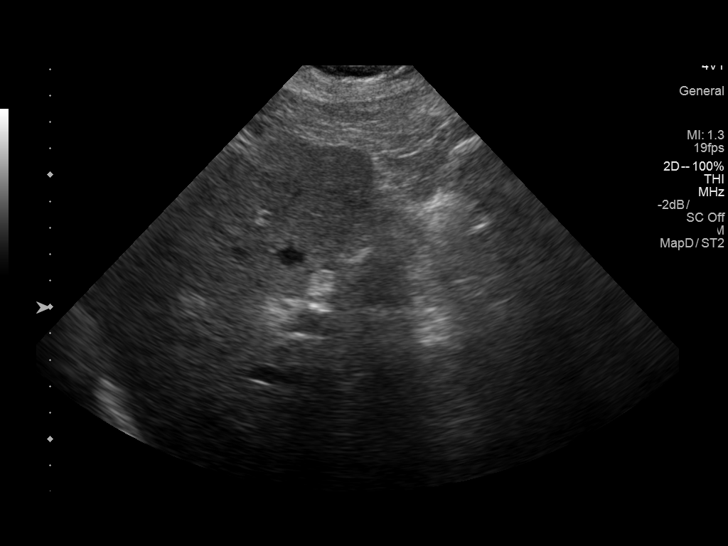
[im 32/59]
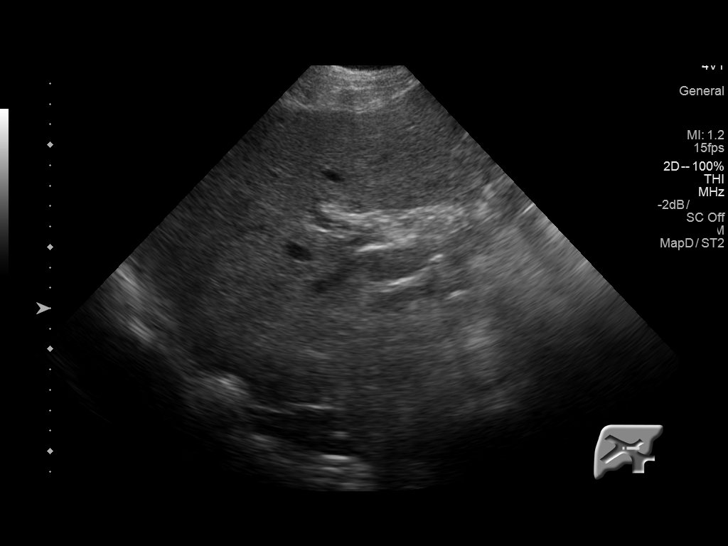
[im 37/59]
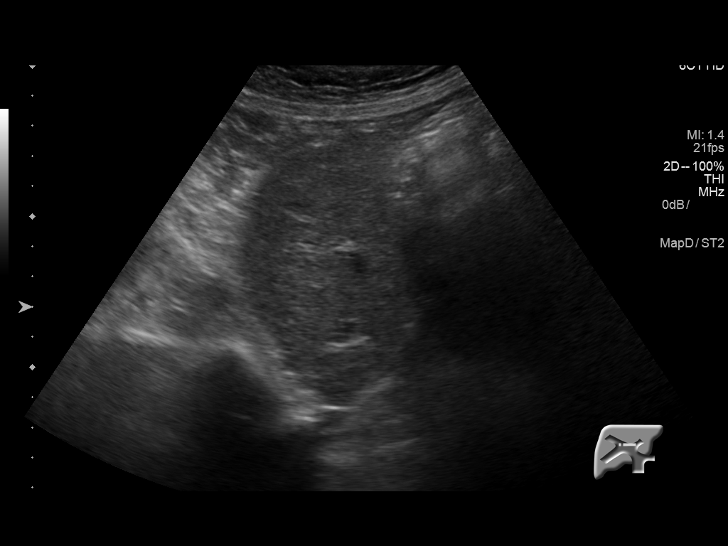
[im 39/59]
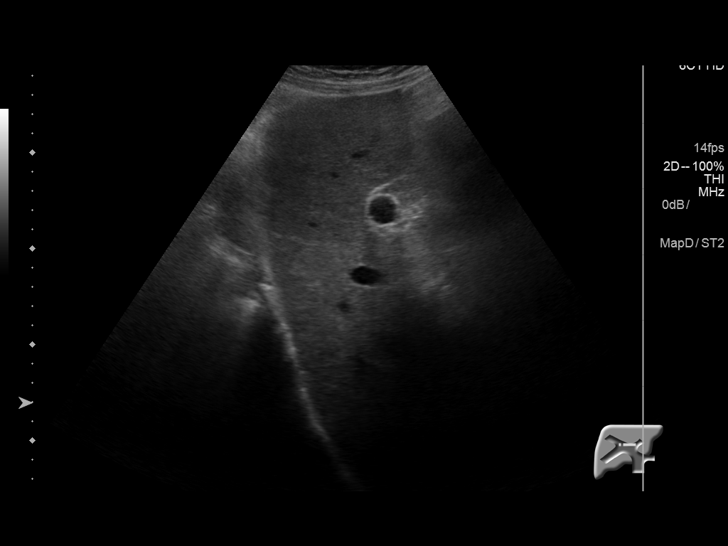
[im 44/59]
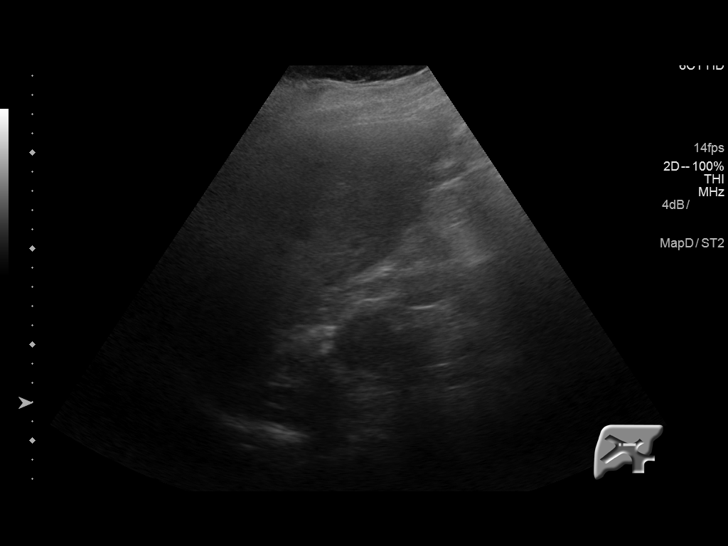
[im 49/59]
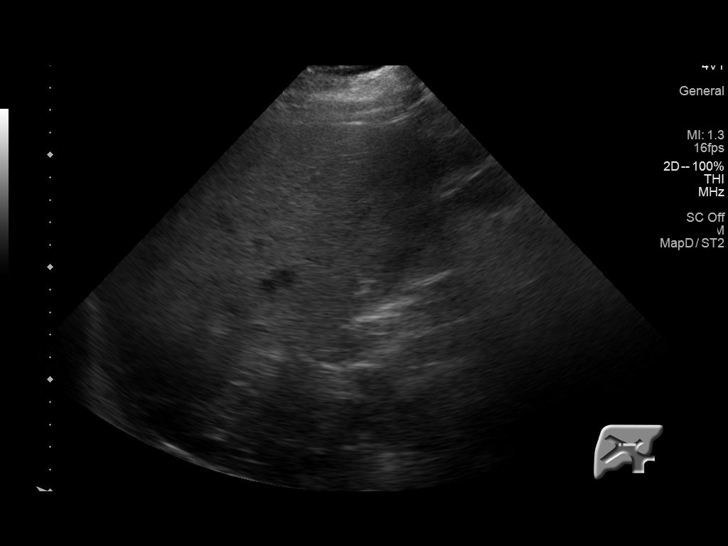
[im 54/59]
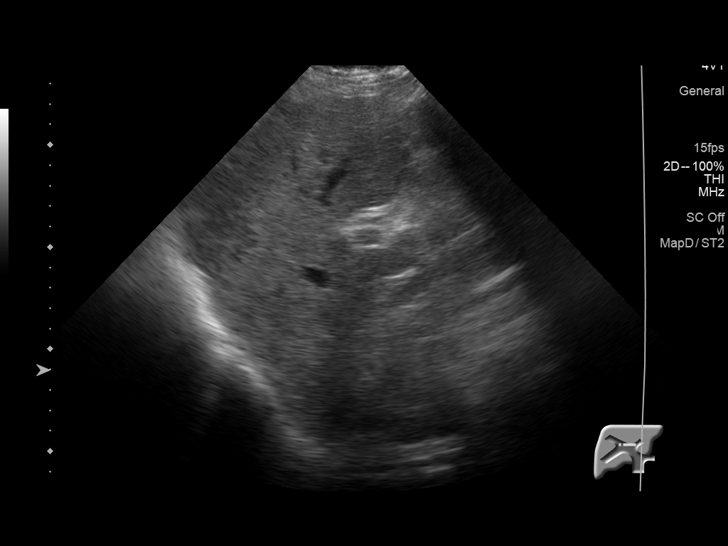
[im 59/59]
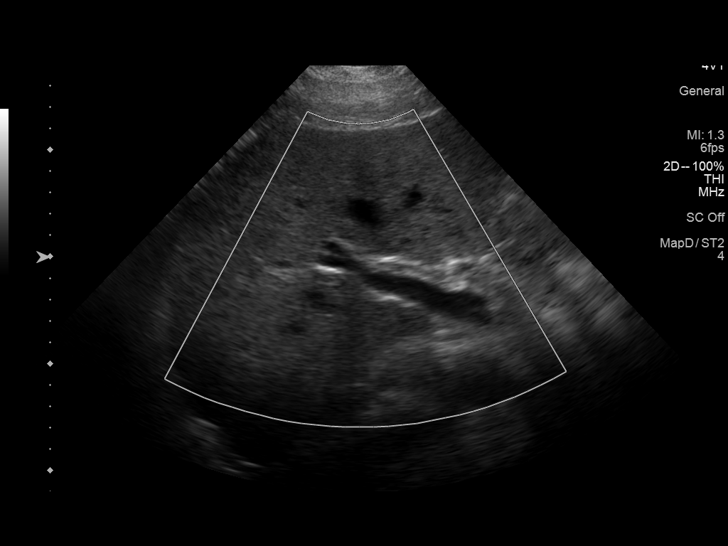

[14 of 25 positions shown; findings below may reference images not displayed]

FINDINGS: Gallbladder:

Surgically absent

Common bile duct:

Diameter: 3.3 mm

Liver:

Increased hepatic echogenicity with mild heterogeneity compatible
with NASH cirrhosis. No intrahepatic biliary dilatation. Small
well-circumscribed echogenic hepatic lesion measures 11 mm centrally
within the liver along the porta hepatis, suspect hemangioma.

Portal vein is patent on color Doppler imaging with normal direction
of blood flow towards the liver.
IMPRESSION: Remote cholecystectomy

Ultrasound findings consistent with NASH cirrhosis.

11 mm well-circumscribed echogenic hepatic lesion in the porta
hepatis, suspect hemangioma. Consider follow-up in 6 months to
document stability.

No acute finding by ultrasound
